# Patient Record
Sex: Female | Born: 1955 | Race: Black or African American | Hispanic: No | Marital: Single | Smoking: Never smoker
Health system: Southern US, Community
[De-identification: ages and names within clinical notes are randomized; demographics above are authoritative.]

## PROBLEM LIST (undated history)

## (undated) DIAGNOSIS — J302 Other seasonal allergic rhinitis: Secondary | ICD-10-CM

## (undated) DIAGNOSIS — S0300XA Dislocation of jaw, unspecified side, initial encounter: Secondary | ICD-10-CM

## (undated) DIAGNOSIS — K8020 Calculus of gallbladder without cholecystitis without obstruction: Secondary | ICD-10-CM

## (undated) DIAGNOSIS — K219 Gastro-esophageal reflux disease without esophagitis: Secondary | ICD-10-CM

## (undated) DIAGNOSIS — E119 Type 2 diabetes mellitus without complications: Secondary | ICD-10-CM

## (undated) DIAGNOSIS — N3289 Other specified disorders of bladder: Secondary | ICD-10-CM

## (undated) DIAGNOSIS — R112 Nausea with vomiting, unspecified: Secondary | ICD-10-CM

## (undated) DIAGNOSIS — M545 Low back pain, unspecified: Secondary | ICD-10-CM

## (undated) DIAGNOSIS — R51 Headache: Secondary | ICD-10-CM

## (undated) DIAGNOSIS — I10 Essential (primary) hypertension: Secondary | ICD-10-CM

## (undated) DIAGNOSIS — Z9889 Other specified postprocedural states: Secondary | ICD-10-CM

## (undated) DIAGNOSIS — K760 Fatty (change of) liver, not elsewhere classified: Secondary | ICD-10-CM

## (undated) DIAGNOSIS — E785 Hyperlipidemia, unspecified: Secondary | ICD-10-CM

## (undated) DIAGNOSIS — G4730 Sleep apnea, unspecified: Secondary | ICD-10-CM

## (undated) HISTORY — DX: Type 2 diabetes mellitus without complications: E11.9

## (undated) HISTORY — PX: COLONOSCOPY: SHX174

## (undated) HISTORY — PX: DILATION AND CURETTAGE OF UTERUS: SHX78

---

## 1998-06-30 ENCOUNTER — Ambulatory Visit (HOSPITAL_COMMUNITY): Admission: RE | Admit: 1998-06-30 | Discharge: 1998-06-30 | Payer: Self-pay | Admitting: Gastroenterology

## 1999-04-05 ENCOUNTER — Ambulatory Visit (HOSPITAL_COMMUNITY): Admission: RE | Admit: 1999-04-05 | Discharge: 1999-04-05 | Payer: Self-pay | Admitting: Family Medicine

## 2000-12-31 HISTORY — PX: ROTATOR CUFF REPAIR: SHX139

## 2001-10-27 ENCOUNTER — Ambulatory Visit (HOSPITAL_COMMUNITY): Admission: RE | Admit: 2001-10-27 | Discharge: 2001-10-27 | Payer: Self-pay | Admitting: Gastroenterology

## 2001-12-29 ENCOUNTER — Emergency Department (HOSPITAL_COMMUNITY): Admission: EM | Admit: 2001-12-29 | Discharge: 2001-12-29 | Payer: Self-pay | Admitting: Emergency Medicine

## 2002-08-22 ENCOUNTER — Encounter: Payer: Self-pay | Admitting: Emergency Medicine

## 2002-08-22 ENCOUNTER — Emergency Department (HOSPITAL_COMMUNITY): Admission: EM | Admit: 2002-08-22 | Discharge: 2002-08-22 | Payer: Self-pay | Admitting: Emergency Medicine

## 2002-10-20 ENCOUNTER — Encounter: Payer: Self-pay | Admitting: Family Medicine

## 2002-10-20 ENCOUNTER — Encounter: Admission: RE | Admit: 2002-10-20 | Discharge: 2002-10-20 | Payer: Self-pay | Admitting: Family Medicine

## 2002-12-31 HISTORY — PX: NASAL SEPTUM SURGERY: SHX37

## 2003-03-12 ENCOUNTER — Encounter: Payer: Self-pay | Admitting: Family Medicine

## 2003-03-12 ENCOUNTER — Encounter: Admission: RE | Admit: 2003-03-12 | Discharge: 2003-03-12 | Payer: Self-pay | Admitting: Family Medicine

## 2003-04-22 ENCOUNTER — Ambulatory Visit (HOSPITAL_BASED_OUTPATIENT_CLINIC_OR_DEPARTMENT_OTHER): Admission: RE | Admit: 2003-04-22 | Discharge: 2003-04-22 | Payer: Self-pay | Admitting: Otolaryngology

## 2003-05-21 ENCOUNTER — Ambulatory Visit (HOSPITAL_BASED_OUTPATIENT_CLINIC_OR_DEPARTMENT_OTHER): Admission: RE | Admit: 2003-05-21 | Discharge: 2003-05-21 | Payer: Self-pay | Admitting: Otolaryngology

## 2003-05-21 ENCOUNTER — Encounter (INDEPENDENT_AMBULATORY_CARE_PROVIDER_SITE_OTHER): Payer: Self-pay | Admitting: *Deleted

## 2004-07-07 ENCOUNTER — Encounter: Admission: RE | Admit: 2004-07-07 | Discharge: 2004-07-07 | Payer: Self-pay | Admitting: Specialist

## 2005-07-22 ENCOUNTER — Emergency Department (HOSPITAL_COMMUNITY): Admission: EM | Admit: 2005-07-22 | Discharge: 2005-07-22 | Payer: Self-pay | Admitting: Emergency Medicine

## 2006-08-09 ENCOUNTER — Encounter: Admission: RE | Admit: 2006-08-09 | Discharge: 2006-08-09 | Payer: Self-pay | Admitting: Specialist

## 2007-12-26 ENCOUNTER — Encounter: Admission: RE | Admit: 2007-12-26 | Discharge: 2007-12-26 | Payer: Self-pay | Admitting: Family Medicine

## 2008-12-30 ENCOUNTER — Emergency Department (HOSPITAL_COMMUNITY): Admission: EM | Admit: 2008-12-30 | Discharge: 2008-12-30 | Payer: Self-pay | Admitting: Emergency Medicine

## 2009-09-16 ENCOUNTER — Encounter: Admission: RE | Admit: 2009-09-16 | Discharge: 2009-09-16 | Payer: Self-pay | Admitting: Family Medicine

## 2010-07-21 ENCOUNTER — Encounter: Admission: RE | Admit: 2010-07-21 | Discharge: 2010-07-21 | Payer: Self-pay | Admitting: Family Medicine

## 2011-01-20 ENCOUNTER — Encounter: Payer: Self-pay | Admitting: Family Medicine

## 2011-05-18 NOTE — Op Note (Signed)
[name], [name]                      ACCOUNT NO.:  [id number]   MEDICAL RECORD NO.:  [id number]                   PATIENT TYPE:  AMB   LOCATION:  DSC                                  FACILITY:  [facility]   PHYSICIAN:  Hermelinda Medicus, M.D.                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   PROCEDURE:  Septal reconstruction, turbinate reduction, reduction of concha  bullosa.   SURGEON:  Hermelinda Medicus, M.D.   ANESTHESIA:  Local 1% Xylocaine with epinephrine, topical cocaine 200 mg  with general MAC anesthesia standby with Dr. Gelene Mink.   DESCRIPTION OF PROCEDURE:  The patient was placed in the supine position  under local anesthesia and was prepped and draped in the appropriate manner  and then the nose was anesthetized using 200 mg of topical cocaine in 60 cc  of 1% Xylocaine with epinephrine.  Once the anesthesia was instilled the  inferior turbinates were able to be pushed laterally using the butter knife.  No mucous membrane was removed and we gained considerable space in the  initial part of this procedure; especially so we could get not only space,  but visualization.  We also reduced the concha bullosa left using the polyp  forceps.  Again, no mucous membrane was removed.   At that point, we made a hemitransfixion incision on the left side, around  the columella to the right side, and elevated the mucoperichondrium back to  an area where the septum, as I expected, was telescoped and deviated to the  right and then swung back  to the left especially in the T-ethmoid region  indicative of some past history of nasal trauma.  Once the mucoperichondrium  and mucoperiosteum was elevated a strip of quadrilateral cartilage was taken  in the posterior aspect of the nose.  The open and close Jansen-Middleton's  were then used to take down this telescoped and traumatized ethmoid septum,  and once we worked back to a more thin septal structure, we worked then into  the vomerine septum which was also involved with the premaxillary crest  which was in quite good condition.  We were able to just fracture the septum  and bring it back to the midline.  Once this was completed the septal  deviation was corrected, closure with a 5-0 plain catgut and a through-and-  through septal suture using 4-0  plain x2 was used as a hemostatic stitch.  Intranasal dressing using Telfa  was placed.  The removal of this dressing will  hopefully be approximately  this afternoon at 3:30 and follow up will then be in 5 days, then 2 weeks,  and 4 weeks, and 6 weeks.                                               Hermelinda Medicus, M.D.    JC/MEDQ  D:  05/21/2003  T:  05/21/2003  Job:  [id number]   cc:   Teena Irani. Arlyce Dice, M.D.  [address]  [place]  [place] [address]  Fax: [phone]

## 2011-05-18 NOTE — H&P (Signed)
NAMEWILMARIE, [name]                      ACCOUNT NO.:  [id number]   MEDICAL RECORD NO.:  [id number]                   PATIENT TYPE:  AMB   LOCATION:  DSC                                  FACILITY:  [facility]   PHYSICIAN:  Hermelinda Medicus, M.D.                DATE OF BIRTH:  [birth year]   DATE OF ADMISSION:  05/21/2003  DATE OF DISCHARGE:                                HISTORY & PHYSICAL   HISTORY OF PRESENT ILLNESS:  The patient is a 55 year old female who works  in an environment of considerable dust and chemicals.  Entered my office  with multiple complaints, first of nasal obstruction, second of sinusitis  having had apparently five infections per year and after reviewing the  records noting her to be primarily on Augmentin and Tequin.  Thirdly, she  was having allergy problems and was on Allegra and fourthly she was having  the nasal obstruction with cough, associated sleep apnea.  She does weigh  220 pounds and with this she has had considerable obstruction, considerable  noisy breathing, and had a CAT scan done of her sinuses which shows some  mild maxillary sinusitis, but more concha bullosa and septal deviation and  she also has had a polysomnogram which was surprisingly good showing a  respiratory disturbance index of only 1.9, lowest O2 saturation of 89, but  loud snoring with mild oxygen desaturation.  She also was unable to get in  deep sleep.  After all this work-up her major problem because of the  sinusitis problem and the snoring was that of a septal deviation with nasal  obstruction and she now enters for a septal reconstruction and turbinate  reduction.   MEDICATIONS:  She takes the Allegra 180 and Nexium 40 daily.   ALLERGIES:  IBUPROFEN which caused tonsillar swelling and tongue swelling.   PAST SURGICAL HISTORY:  Her only surgery in the past is bone spurs off her  shoulder and a colonoscopy.   REVIEW OF SYSTEMS:  She has no cardiac, pulmonary, neurologic,  hematology  problems.  She does have the reflux as above mentioned.  She has no other  pain, skin, or any GU problems.   PHYSICAL EXAMINATION:  GENERAL:  She is a well-nourished to obese 215 pound  5 feet 3 inch female.  VITAL SIGNS:  Blood pressure 116/78, respirations 20, heart rate 60.  HEENT:  Ears are clear.  The tympanic membranes are clear.  The nose shows a  septal deviation where it is deviated toward the right in the high septum  and then toward the left as if she had some telescoping from possible  previous nasal trauma.  The turbinates are very large and obstructive as  well as a left concha bullosa.  The oral cavity is clear, fairly small.  The  tongue rides high because she has a full neck.  The neck, however, is free  of any thyromegaly, cervical  adenopathy, or mass.  The larynx is clear.  True cords, false cords, epiglottis, base of tongue are clear.  No  ulceration or mass.  Dentition is in fairly good condition.  CHEST:  Clear.  No rales, rhonchi, or wheezes.  CARDIOVASCULAR:  No __________ murmurs or gallops.  ABDOMEN:  Obese.  EXTREMITIES:  No liver, spleen, or kidneys palpable.  Unremarkable.   INITIAL DIAGNOSES:  1. Mild sleep apnea with severe snoring.  2. Persistent sinusitis and allergic rhinitis with septal deviation,     turbinate hypertrophy with concha bullosa.  3. History of bone spurs from her shoulders.  4.     History of colonoscopy.  5. History of allergy to ibuprofen.   Our plan is to do a septal reconstruction, turbinate reduction, reduction of  concha bullosa.                                               Hermelinda Medicus, M.D.    JC/MEDQ  D:  05/21/2003  T:  05/21/2003  Job:  [id number]   cc:   Teena Irani. Arlyce Dice, M.D.  [address]  [place]  [place] [address]  Fax: [phone]

## 2011-05-18 NOTE — Procedures (Signed)
[facility]  Patient:    [name], [name] Visit Number: [id number] MRN: [id number]          Service Type: END Location: ENDO Attending Physician:  Orland Mustard Dictated by:   Llana Aliment. Randa Evens, M.D. Proc. Date: 10/27/01 Admit Date:  10/27/2001   CC:         Teena Irani. Arlyce Dice, M.D.                           Procedure Report  PROCEDURE:  Colonoscopy.  MEDICATIONS:  Fentanyl 75 mcg, Versed 8 mg IV.  INDICATION:  Rectal bleeding.  He has had an unusual polyp removed in the past, suggesting angiodysplasia; it is probably a hyperplastic polyp.  DESCRIPTION OF PROCEDURE:  The procedure had been explained to the patient and consent obtained.  With the patient in left lateral decubitus position, the Olympus adult video colonoscope was inserted and advanced under direct visualization.  The prep was excellent.  The scope was advanced to the cecum, where the ileocecal valve was seen and appendiceal orifice seen.  The scope was withdrawn.  The cecum, ascending colon, hepatic flexure, transverse colon, splenic flexure, descending and sigmoid colon were seen well upon removal; no polyps seen.  Internal hemorrhoids were seen in the rectum.  The scope was withdrawn.  The patient tolerated the procedure well.  ASSESSMENT:  Rectal bleeding, probably due to internal hemorrhoids.  PLAN:  Will recommend repeating the procedure in three to four weeks. Dictated by:   Llana Aliment. Randa Evens, M.D. Attending Physician:  Orland Mustard DD:  10/27/01 TD:  10/28/01 Job: [id number]

## 2011-10-04 LAB — POCT I-STAT, CHEM 8
BUN: 6 mg/dL (ref 6–23)
Glucose, Bld: 146 mg/dL — ABNORMAL HIGH (ref 70–99)
Sodium: 139 mEq/L (ref 135–145)
TCO2: 29 mmol/L (ref 0–100)

## 2013-07-08 ENCOUNTER — Encounter (INDEPENDENT_AMBULATORY_CARE_PROVIDER_SITE_OTHER): Payer: Self-pay

## 2013-07-10 ENCOUNTER — Ambulatory Visit (INDEPENDENT_AMBULATORY_CARE_PROVIDER_SITE_OTHER): Payer: 59 | Admitting: General Surgery

## 2013-07-10 ENCOUNTER — Encounter (INDEPENDENT_AMBULATORY_CARE_PROVIDER_SITE_OTHER): Payer: Self-pay | Admitting: General Surgery

## 2013-07-10 VITALS — BP 128/86 | HR 71 | Temp 97.0°F | Resp 16 | Ht 63.0 in | Wt 230.4 lb

## 2013-07-10 DIAGNOSIS — K8020 Calculus of gallbladder without cholecystitis without obstruction: Secondary | ICD-10-CM

## 2013-07-10 NOTE — Progress Notes (Signed)
Patient ID: [name], female   DOB: 18-Oct-1956, 57 y.o.   MRN: [id number]  No chief complaint on file.   HPI [name] is a 57 y.o. female. This patient is referred by Dr. Dareen Piano for evaluation of symptomatic cholelithiasis.  She saw her primary physician for routine followup for her diabetes and laboratory studies and she was noted to have elevated LFTs. Ultrasound was obtained which demonstrated cholelithiasis with multiple gallstones measuring up to 1.2 cm without evidence of acute cholecystitis. Is also showed fatty infiltration of the liver. She says that she does have some right-sided and right flank and back pain almost every day and she describes this as constant and "sore". She says that it is worse with eating but she has not attributed to any particular type of food. She has some associated abdominal bloating and nausea without any vomiting or fevers. Her bowels are normal but she does have some occasional diarrhea. She said that she has a family history of gallbladder problems as well and her mother require cholecystectomy as well. She says that her diabetes has not been very well controlled since the diagnosis of about 2 years ago and has hypertension as well.  HPI  Past Medical History  Diagnosis Date  . Diabetes mellitus without complication     No past surgical history on file.  No family history on file.  Social History History  Substance Use Topics  . Smoking status: Never Smoker   . Smokeless tobacco: Not on file  . Alcohol Use: No    Allergies  Allergen Reactions  . Ace Inhibitors   . Diovan (Valsartan)   . Ibuprofen     Current Outpatient Prescriptions  Medication Sig Dispense Refill  . glimepiride (AMARYL) 4 MG tablet Take 4 mg by mouth daily before breakfast.      . metFORMIN (GLUCOPHAGE) 1000 MG tablet Take 1,000 mg by mouth 2 (two) times daily with a meal.      . metoprolol-hydrochlorothiazide (LOPRESSOR HCT) 50-25 MG per tablet Take 1  tablet by mouth daily.      . montelukast (SINGULAIR) 10 MG tablet Take 10 mg by mouth at bedtime.      . triamcinolone (NASACORT AQ) 55 MCG/ACT nasal inhaler Place 2 sprays into the nose daily.       No current facility-administered medications for this visit.    Review of Systems Review of Systems All other review of systems negative or noncontributory except as stated in the HPI   Blood pressure 128/86, pulse 71, temperature 97 F (36.1 C), temperature source Temporal, resp. rate 16, height 5\' 3"  (1.6 m), weight 230 lb 6.4 oz (104.509 kg).  Physical Exam Physical Exam Physical Exam  Nursing note and vitals reviewed. Constitutional: She is oriented to person, place, and time. She appears well-developed and well-nourished. No distress.  HENT:  Head: Normocephalic and atraumatic.  Mouth/Throat: No oropharyngeal exudate.  Eyes: Conjunctivae and EOM are normal. Pupils are equal, round, and reactive to light. Right eye exhibits no discharge. Left eye exhibits no discharge. No scleral icterus.  Neck: Normal range of motion. Neck supple. No tracheal deviation present.  Cardiovascular: Normal rate, regular rhythm, normal heart sounds and intact distal pulses.   Pulmonary/Chest: Effort normal and breath sounds normal. No stridor. No respiratory distress. She has no wheezes.  Abdominal: Soft. Bowel sounds are normal. She exhibits no distension and no mass. There is mild epigastric tenderness. There is no rebound and no guarding.  Musculoskeletal: Normal  range of motion. She exhibits no edema and no tenderness.  Neurological: She is alert and oriented to person, place, and time.  Skin: Skin is warm and dry. No rash noted. She is not diaphoretic. No erythema. No pallor.  Psychiatric: She has a normal mood and affect. Her behavior is normal. Judgment and thought content normal.    Data Reviewed Outside records, labs, and [place]  Assessment    Symptomatic cholelithiasis She does have  cholelithiasis on ultrasound and symptoms consistent with symptomatic cholelithiasis. I have offered her to endoscopic cholecystectomy for possible relief of her symptoms.  Those she does have right-sided abdominal and back tenderness with nausea and bloating, and found not 100% certain that her symptoms are from her gallbladder but I think we certainly have enough evidence to justify cholecystectomy.  I did discuss with her the procedure and its risks.The risks of infection, bleeding, pain, persistent symptoms, scarring, injury to bowel or bile ducts, retained stone, diarrhea, need for additional procedures, and need for open surgery discussed with the patient.  I provided some literature to read as well. She is leaning towards cholecystectomy but I recommended that she take a few days to think about the options and discuss this with her primary care physician and to call the back next week if she would like to go ahead and pursue cholecystectomy. Morbid obesity with obesity related comorbidities of diabetes, hypertension, and fatty liver I also discussed with her the benefits of weight loss and discuss some of the weight loss surgical options. She is having a hard time getting her diabetes under control and I think her elevated LFTs are most likely due to fatty liver. I think that she would benefit from significant weight loss and if she was interested in weight loss surgery, I would be happy to discuss this further with her as well.       Plan    She will take a few days to discuss this with her primary physician is pink about the options and she will call him back next week to let me know her decision in to possibly schedule cholecystectomy        Gilma Bessette DAVID 07/10/2013, 10:55 AM

## 2013-07-14 ENCOUNTER — Telehealth (INDEPENDENT_AMBULATORY_CARE_PROVIDER_SITE_OTHER): Payer: Self-pay

## 2013-07-14 NOTE — Telephone Encounter (Signed)
The pt called to state she is ready to schedule Gallbladder surgery.  She is having pain.  Her medical md is out sick so she hasn't talked to her but she feels she wants her to have it done.  She does not want to pursue the Lap Band right now because she has issues with her stomach.

## 2013-07-22 ENCOUNTER — Other Ambulatory Visit (INDEPENDENT_AMBULATORY_CARE_PROVIDER_SITE_OTHER): Payer: Self-pay | Admitting: General Surgery

## 2013-07-22 DIAGNOSIS — K8020 Calculus of gallbladder without cholecystitis without obstruction: Secondary | ICD-10-CM

## 2013-07-27 ENCOUNTER — Telehealth (INDEPENDENT_AMBULATORY_CARE_PROVIDER_SITE_OTHER): Payer: Self-pay | Admitting: General Surgery

## 2013-07-27 NOTE — Telephone Encounter (Signed)
Spoke to patient about financial responsibilities to schedule surgery 07/23/13 ,patient will call back to schedule

## 2013-08-07 ENCOUNTER — Encounter (HOSPITAL_COMMUNITY)
Admission: RE | Admit: 2013-08-07 | Discharge: 2013-08-07 | Disposition: A | Discharge status: Home / Self Care | Payer: 59 | Source: Ambulatory Visit | Attending: General Surgery | Admitting: General Surgery

## 2013-08-07 ENCOUNTER — Encounter (HOSPITAL_COMMUNITY): Payer: Self-pay | Admitting: Pharmacy Technician

## 2013-08-07 ENCOUNTER — Ambulatory Visit (HOSPITAL_COMMUNITY)
Admission: RE | Admit: 2013-08-07 | Discharge: 2013-08-07 | Disposition: A | Discharge status: Home / Self Care | Payer: 59 | Source: Ambulatory Visit | Attending: General Surgery | Admitting: General Surgery

## 2013-08-07 ENCOUNTER — Encounter (HOSPITAL_COMMUNITY): Payer: Self-pay

## 2013-08-07 VITALS — BP 135/79 | HR 59 | Temp 98.2°F | Resp 16 | Ht 63.0 in | Wt 227.0 lb

## 2013-08-07 DIAGNOSIS — I498 Other specified cardiac arrhythmias: Secondary | ICD-10-CM | POA: Insufficient documentation

## 2013-08-07 DIAGNOSIS — R9431 Abnormal electrocardiogram [ECG] [EKG]: Secondary | ICD-10-CM | POA: Insufficient documentation

## 2013-08-07 DIAGNOSIS — Z01818 Encounter for other preprocedural examination: Secondary | ICD-10-CM | POA: Insufficient documentation

## 2013-08-07 DIAGNOSIS — Z01810 Encounter for preprocedural cardiovascular examination: Secondary | ICD-10-CM | POA: Insufficient documentation

## 2013-08-07 DIAGNOSIS — Z01812 Encounter for preprocedural laboratory examination: Secondary | ICD-10-CM | POA: Insufficient documentation

## 2013-08-07 DIAGNOSIS — K8020 Calculus of gallbladder without cholecystitis without obstruction: Secondary | ICD-10-CM

## 2013-08-07 HISTORY — DX: Nausea with vomiting, unspecified: R11.2

## 2013-08-07 HISTORY — DX: Other seasonal allergic rhinitis: J30.2

## 2013-08-07 HISTORY — DX: Headache: R51

## 2013-08-07 HISTORY — DX: Gastro-esophageal reflux disease without esophagitis: K21.9

## 2013-08-07 HISTORY — DX: Low back pain, unspecified: M54.50

## 2013-08-07 HISTORY — DX: Other specified disorders of bladder: N32.89

## 2013-08-07 HISTORY — DX: Fatty (change of) liver, not elsewhere classified: K76.0

## 2013-08-07 HISTORY — DX: Essential (primary) hypertension: I10

## 2013-08-07 HISTORY — DX: Low back pain: M54.5

## 2013-08-07 HISTORY — DX: Other specified postprocedural states: Z98.890

## 2013-08-07 HISTORY — DX: Sleep apnea, unspecified: G47.30

## 2013-08-07 LAB — COMPREHENSIVE METABOLIC PANEL
ALT: 23 U/L (ref 0–35)
AST: 16 U/L (ref 0–37)
Albumin: 3.3 g/dL — ABNORMAL LOW (ref 3.5–5.2)
Creatinine, Ser: 0.63 mg/dL (ref 0.50–1.10)
GFR calc Af Amer: >90 mL/min (ref >90)
GFR calc non Af Amer: >90 mL/min (ref >90)
Glucose, Bld: 135 mg/dL — ABNORMAL HIGH (ref 70–99)
Potassium: 3.6 mEq/L (ref 3.5–5.1)
Sodium: 139 mEq/L (ref 135–145)
Total Bilirubin: 0.4 mg/dL (ref 0.3–1.2)

## 2013-08-07 LAB — CBC WITH DIFFERENTIAL/PLATELET
Basophils Absolute: 0 10*3/uL (ref 0.0–0.1)
Basophils Relative: 0 % (ref 0–1)
HCT: 37.5 % (ref 36.0–46.0)
Hemoglobin: 13.1 g/dL (ref 12.0–15.0)
MCH: 26.2 pg (ref 26.0–34.0)
MCHC: 34.9 g/dL (ref 30.0–36.0)
Neutro Abs: 3.8 10*3/uL (ref 1.7–7.7)
Neutrophils Relative %: 50 % (ref 43–77)
Platelets: 260 10*3/uL (ref 150–400)

## 2013-08-07 NOTE — Patient Instructions (Addendum)
20 [name]  08/07/2013   Your procedure is scheduled on: 08/14/13  Report to [facility] at 5:30 AM.  Call this number if you have problems the morning of surgery [phone]: [phone]   Remember: please do not take any diabetic medication on the day of surgery   Do not eat food or drink liquids After Midnight.     Do not wear jewelry, make-up or nail polish.  Do not wear lotions, powders, or perfumes. You may wear deodorant.  Do not shave 48 hours prior to surgery. Men may shave face and neck.  Do not bring valuables to the hospital.  Contacts, dentures or bridgework may not be worn into surgery.     Patients discharged the day of surgery will not be allowed to drive home.  Name and phone number of your driver: brothers or sisters    Birdie Sons, RN  pre op nurse call if needed [phone]    FAILURE TO FOLLOW THESE INSTRUCTIONS MAY RESULT IN CANCELLATION OF YOUR SURGERY   Patient Signature: ___________________________________________

## 2013-08-14 ENCOUNTER — Ambulatory Visit (HOSPITAL_COMMUNITY)
Admission: RE | Admit: 2013-08-14 | Discharge: 2013-08-14 | Disposition: A | Discharge status: Home / Self Care | Payer: 59 | Source: Ambulatory Visit | Attending: General Surgery | Admitting: General Surgery

## 2013-08-14 ENCOUNTER — Encounter (HOSPITAL_COMMUNITY)
Admission: RE | Disposition: A | Discharge status: Home / Self Care | Payer: Self-pay | Source: Ambulatory Visit | Attending: General Surgery

## 2013-08-14 ENCOUNTER — Encounter (HOSPITAL_COMMUNITY): Payer: Self-pay | Admitting: *Deleted

## 2013-08-14 ENCOUNTER — Ambulatory Visit (HOSPITAL_COMMUNITY): Payer: 59

## 2013-08-14 ENCOUNTER — Ambulatory Visit (HOSPITAL_COMMUNITY): Payer: 59 | Admitting: Anesthesiology

## 2013-08-14 ENCOUNTER — Encounter (HOSPITAL_COMMUNITY): Payer: Self-pay | Admitting: Anesthesiology

## 2013-08-14 DIAGNOSIS — E119 Type 2 diabetes mellitus without complications: Secondary | ICD-10-CM | POA: Insufficient documentation

## 2013-08-14 DIAGNOSIS — G4730 Sleep apnea, unspecified: Secondary | ICD-10-CM | POA: Insufficient documentation

## 2013-08-14 DIAGNOSIS — K8020 Calculus of gallbladder without cholecystitis without obstruction: Secondary | ICD-10-CM

## 2013-08-14 DIAGNOSIS — Z79899 Other long term (current) drug therapy: Secondary | ICD-10-CM | POA: Insufficient documentation

## 2013-08-14 DIAGNOSIS — K8010 Calculus of gallbladder with chronic cholecystitis without obstruction: Secondary | ICD-10-CM

## 2013-08-14 DIAGNOSIS — I10 Essential (primary) hypertension: Secondary | ICD-10-CM | POA: Insufficient documentation

## 2013-08-14 DIAGNOSIS — K219 Gastro-esophageal reflux disease without esophagitis: Secondary | ICD-10-CM | POA: Insufficient documentation

## 2013-08-14 HISTORY — PX: CHOLECYSTECTOMY: SHX55

## 2013-08-14 LAB — GLUCOSE, CAPILLARY

## 2013-08-14 SURGERY — LAPAROSCOPIC CHOLECYSTECTOMY WITH INTRAOPERATIVE CHOLANGIOGRAM
Anesthesia: General | Wound class: Contaminated

## 2013-08-14 MED ORDER — PROMETHAZINE HCL 25 MG/ML IJ SOLN
INTRAMUSCULAR | Status: AC
  Filled 2013-08-14: qty 1

## 2013-08-14 MED ORDER — PROMETHAZINE HCL 25 MG/ML IJ SOLN
6.2500 mg | INTRAMUSCULAR | Status: DC | PRN
  Administered 2013-08-14: 6.25 mg via INTRAVENOUS

## 2013-08-14 MED ORDER — ROCURONIUM BROMIDE 100 MG/10ML IV SOLN
INTRAVENOUS | Status: DC | PRN
  Administered 2013-08-14: 30 mg via INTRAVENOUS

## 2013-08-14 MED ORDER — BUPIVACAINE HCL (PF) 0.25 % IJ SOLN
INTRAMUSCULAR | Status: AC
  Filled 2013-08-14: qty 30

## 2013-08-14 MED ORDER — SCOPOLAMINE 1 MG/3DAYS TD PT72
MEDICATED_PATCH | TRANSDERMAL | Status: AC
  Filled 2013-08-14: qty 1

## 2013-08-14 MED ORDER — SCOPOLAMINE 1 MG/3DAYS TD PT72
MEDICATED_PATCH | TRANSDERMAL | Status: DC | PRN
  Administered 2013-08-14: 1 via TRANSDERMAL

## 2013-08-14 MED ORDER — ONDANSETRON HCL 4 MG/2ML IJ SOLN
INTRAMUSCULAR | Status: DC | PRN
  Administered 2013-08-14: 4 mg via INTRAVENOUS

## 2013-08-14 MED ORDER — NEOSTIGMINE METHYLSULFATE 1 MG/ML IJ SOLN
INTRAMUSCULAR | Status: DC | PRN
  Administered 2013-08-14: 4 mg via INTRAVENOUS

## 2013-08-14 MED ORDER — HYDROCODONE-ACETAMINOPHEN 5-325 MG PO TABS: 1.0000 | ORAL_TABLET | ORAL | Status: DC | PRN

## 2013-08-14 MED ORDER — MEPERIDINE HCL 50 MG/ML IJ SOLN: 6.2500 mg | INTRAMUSCULAR | Status: DC | PRN

## 2013-08-14 MED ORDER — LACTATED RINGERS IV SOLN: INTRAVENOUS | Status: DC | PRN

## 2013-08-14 MED ORDER — LIDOCAINE HCL (PF) 2 % IJ SOLN
INTRAMUSCULAR | Status: DC | PRN
  Administered 2013-08-14: 20 mg

## 2013-08-14 MED ORDER — CEFAZOLIN SODIUM-DEXTROSE 2-3 GM-% IV SOLR
2.0000 g | INTRAVENOUS | Status: AC
  Administered 2013-08-14: 2 g via INTRAVENOUS

## 2013-08-14 MED ORDER — LACTATED RINGERS IV SOLN
INTRAVENOUS | Status: DC
  Administered 2013-08-14: 09:00:00 via INTRAVENOUS

## 2013-08-14 MED ORDER — LIDOCAINE-EPINEPHRINE (PF) 1 %-1:200000 IJ SOLN
INTRAMUSCULAR | Status: DC | PRN
  Administered 2013-08-14: 22.5 mL

## 2013-08-14 MED ORDER — METOCLOPRAMIDE HCL 5 MG/ML IJ SOLN
10.0000 mg | Freq: Once | INTRAMUSCULAR | Status: AC
  Administered 2013-08-14: 10 mg via INTRAVENOUS

## 2013-08-14 MED ORDER — IOHEXOL 300 MG/ML  SOLN
INTRAMUSCULAR | Status: AC
  Filled 2013-08-14: qty 1

## 2013-08-14 MED ORDER — OXYCODONE HCL 5 MG PO TABS
5.0000 mg | ORAL_TABLET | Freq: Once | ORAL | Status: AC | PRN
  Administered 2013-08-14: 5 mg via ORAL
  Filled 2013-08-14: qty 1

## 2013-08-14 MED ORDER — OXYCODONE HCL 5 MG/5ML PO SOLN
5.0000 mg | Freq: Once | ORAL | Status: AC | PRN
  Filled 2013-08-14: qty 5

## 2013-08-14 MED ORDER — CEFAZOLIN SODIUM-DEXTROSE 2-3 GM-% IV SOLR
INTRAVENOUS | Status: AC
  Filled 2013-08-14: qty 50

## 2013-08-14 MED ORDER — SODIUM CHLORIDE 0.9 % IR SOLN
Status: DC | PRN
  Administered 2013-08-14: 1000 mL

## 2013-08-14 MED ORDER — METOCLOPRAMIDE HCL 5 MG/ML IJ SOLN
INTRAMUSCULAR | Status: AC
  Filled 2013-08-14: qty 2

## 2013-08-14 MED ORDER — HEPARIN SODIUM (PORCINE) 5000 UNIT/ML IJ SOLN
5000.0000 [IU] | Freq: Once | INTRAMUSCULAR | Status: AC
  Administered 2013-08-14: 5000 [IU] via SUBCUTANEOUS
  Filled 2013-08-14: qty 1

## 2013-08-14 MED ORDER — LIDOCAINE-EPINEPHRINE 1 %-1:100000 IJ SOLN
INTRAMUSCULAR | Status: AC
  Filled 2013-08-14: qty 1

## 2013-08-14 MED ORDER — MIDAZOLAM HCL 5 MG/5ML IJ SOLN
INTRAMUSCULAR | Status: DC | PRN
  Administered 2013-08-14: 2 mg via INTRAVENOUS

## 2013-08-14 MED ORDER — HYDROMORPHONE HCL PF 1 MG/ML IJ SOLN
0.2500 mg | INTRAMUSCULAR | Status: DC | PRN
  Administered 2013-08-14: 0.5 mg via INTRAVENOUS

## 2013-08-14 MED ORDER — LACTATED RINGERS IR SOLN
Status: DC | PRN
  Administered 2013-08-14: 1

## 2013-08-14 MED ORDER — FENTANYL CITRATE 0.05 MG/ML IJ SOLN
INTRAMUSCULAR | Status: DC | PRN
  Administered 2013-08-14 (×2): 50 ug via INTRAVENOUS
  Administered 2013-08-14: 100 ug via INTRAVENOUS
  Administered 2013-08-14: 50 ug via INTRAVENOUS

## 2013-08-14 MED ORDER — LACTATED RINGERS IV SOLN
INTRAVENOUS | Status: DC | PRN
  Administered 2013-08-14: 07:00:00 via INTRAVENOUS

## 2013-08-14 MED ORDER — SUCCINYLCHOLINE CHLORIDE 20 MG/ML IJ SOLN
INTRAMUSCULAR | Status: DC | PRN
  Administered 2013-08-14: 100 mg via INTRAVENOUS

## 2013-08-14 MED ORDER — PROPOFOL 10 MG/ML IV BOLUS
INTRAVENOUS | Status: DC | PRN
  Administered 2013-08-14: 150 mg via INTRAVENOUS

## 2013-08-14 MED ORDER — GLYCOPYRROLATE 0.2 MG/ML IJ SOLN
INTRAMUSCULAR | Status: DC | PRN
  Administered 2013-08-14: 0.6 mg via INTRAVENOUS

## 2013-08-14 MED ORDER — HYDROMORPHONE HCL PF 1 MG/ML IJ SOLN
INTRAMUSCULAR | Status: AC
  Filled 2013-08-14: qty 1

## 2013-08-14 MED ORDER — SODIUM CHLORIDE 0.9 % IJ SOLN
INTRAMUSCULAR | Status: DC | PRN
  Administered 2013-08-14: 08:00:00

## 2013-08-14 MED ORDER — METOPROLOL TARTRATE 50 MG PO TABS
50.0000 mg | ORAL_TABLET | Freq: Once | ORAL | Status: AC
  Administered 2013-08-14: 50 mg via ORAL
  Filled 2013-08-14: qty 1

## 2013-08-14 MED ORDER — BUPIVACAINE HCL (PF) 0.25 % IJ SOLN
INTRAMUSCULAR | Status: DC | PRN
  Administered 2013-08-14: 22.5 mL

## 2013-08-14 SURGICAL SUPPLY — 45 items
ADH SKN CLS APL DERMABOND .7 (GAUZE/BANDAGES/DRESSINGS) ×1
APPLICATOR COTTON TIP 6IN STRL (MISCELLANEOUS) IMPLANT
APPLIER CLIP LOGIC TI 5 (MISCELLANEOUS) IMPLANT
APPLIER CLIP ROT 10 11.4 M/L (STAPLE) ×2
APR CLP MED LRG 11.4X10 (STAPLE) ×1
APR CLP MED LRG 33X5 (MISCELLANEOUS)
BAG SPEC RTRVL LRG 6X4 10 (ENDOMECHANICALS) ×1
BLADE HEX COATED 2.75 (ELECTRODE) ×2 IMPLANT
CABLE HIGH FREQUENCY MONO STRZ (ELECTRODE) ×2 IMPLANT
CANISTER SUCTION 2500CC (MISCELLANEOUS) ×2 IMPLANT
CATH REDDICK CHOLANGI 4FR 50CM (CATHETERS) IMPLANT
CHLORAPREP W/TINT 26ML (MISCELLANEOUS) ×2 IMPLANT
CLIP APPLIE ROT 10 11.4 M/L (STAPLE) ×1 IMPLANT
CLOTH BEACON ORANGE TIMEOUT ST (SAFETY) ×2 IMPLANT
DECANTER SPIKE VIAL GLASS SM (MISCELLANEOUS) ×2 IMPLANT
DERMABOND ADVANCED (GAUZE/BANDAGES/DRESSINGS) ×1
DERMABOND ADVANCED .7 DNX12 (GAUZE/BANDAGES/DRESSINGS) IMPLANT
DRAPE C-ARM 42X120 X-RAY (DRAPES) ×2 IMPLANT
DRAPE LAPAROSCOPIC ABDOMINAL (DRAPES) ×2 IMPLANT
DRAPE UTILITY XL STRL (DRAPES) ×2 IMPLANT
ELECT REM PT RETURN 9FT ADLT (ELECTROSURGICAL) ×2
ELECTRODE REM PT RTRN 9FT ADLT (ELECTROSURGICAL) ×1 IMPLANT
ENDOLOOP SUT PDS II  0 18 (SUTURE)
ENDOLOOP SUT PDS II 0 18 (SUTURE) IMPLANT
GLOVE SURG SS PI 7.5 STRL IVOR (GLOVE) ×4 IMPLANT
GOWN STRL REIN XL XLG (GOWN DISPOSABLE) ×6 IMPLANT
KIT BASIN OR (CUSTOM PROCEDURE TRAY) ×2 IMPLANT
NS IRRIG 1000ML POUR BTL (IV SOLUTION) ×2 IMPLANT
PENCIL BUTTON HOLSTER BLD 10FT (ELECTRODE) ×2 IMPLANT
POUCH SPECIMEN RETRIEVAL 10MM (ENDOMECHANICALS) ×2 IMPLANT
SCISSORS LAP 5X35 DISP (ENDOMECHANICALS) ×2 IMPLANT
SET CHOLANGIOGRAPH MIX (MISCELLANEOUS) ×2 IMPLANT
SET IRRIG TUBING LAPAROSCOPIC (IRRIGATION / IRRIGATOR) ×2 IMPLANT
STRIP CLOSURE SKIN 1/2X4 (GAUZE/BANDAGES/DRESSINGS) IMPLANT
SUT MNCRL AB 4-0 PS2 18 (SUTURE) ×3 IMPLANT
SUT VICRYL 0 UR6 27IN ABS (SUTURE) ×2 IMPLANT
SYR 20CC LL (SYRINGE) ×2 IMPLANT
TOWEL OR 17X26 10 PK STRL BLUE (TOWEL DISPOSABLE) ×2 IMPLANT
TOWEL OR NON WOVEN STRL DISP B (DISPOSABLE) ×2 IMPLANT
TRAY LAP CHOLE (CUSTOM PROCEDURE TRAY) ×2 IMPLANT
TROCAR BALLN 12MMX100 BLUNT (TROCAR) ×2 IMPLANT
TROCAR BLADELESS OPT 5 75 (ENDOMECHANICALS) ×2 IMPLANT
TROCAR SLEEVE XCEL 5X75 (ENDOMECHANICALS) ×2 IMPLANT
TROCAR XCEL NON-BLD 11X100MML (ENDOMECHANICALS) ×2 IMPLANT
TUBING INSUFFLATION 10FT LAP (TUBING) ×2 IMPLANT

## 2013-08-14 NOTE — Transfer of Care (Signed)
Immediate Anesthesia Transfer of Care Note  Patient: [name]  Procedure(s) Performed: Procedure(s) (LRB): LAPAROSCOPIC CHOLECYSTECTOMY WITH INTRAOPERATIVE CHOLANGIOGRAM (N/A)  Patient Location: PACU  Anesthesia Type: General  Level of Consciousness: sedated, patient cooperative and responds to stimulaton  Airway & Oxygen Therapy: Patient Spontanous Breathing and Patient connected to face mask oxgen  Post-op Assessment: Report given to PACU RN and Post -op Vital signs reviewed and stable  Post vital signs: Reviewed and stable  Complications: No apparent anesthesia complications

## 2013-08-14 NOTE — Anesthesia Preprocedure Evaluation (Addendum)
Anesthesia Evaluation  Patient identified by MRN, date of birth, ID band Patient awake    Reviewed: Allergy & Precautions, H&P , NPO status , Patient's Chart, lab work & pertinent test results  History of Anesthesia Complications (+) PONV  Airway Mallampati: II TM Distance: >3 FB Neck ROM: Full    Dental  (+) Dental Advisory Given and Teeth Intact   Pulmonary neg pulmonary ROS, sleep apnea ,  breath sounds clear to auscultation        Cardiovascular hypertension, Pt. on medications Rhythm:Regular Rate:Normal     Neuro/Psych  Headaches, negative neurological ROS  negative psych ROS   GI/Hepatic Neg liver ROS, GERD-  ,  Endo/Other  diabetes, Type 2, Oral Hypoglycemic AgentsMorbid obesity  Renal/GU negative Renal ROS     Musculoskeletal negative musculoskeletal ROS (+)   Abdominal (+) + obese,   Peds  Hematology negative hematology ROS (+)   Anesthesia Other Findings   Reproductive/Obstetrics negative OB ROS                         Anesthesia Physical Anesthesia Plan  ASA: III  Anesthesia Plan: General   Post-op Pain Management:    Induction: Intravenous  Airway Management Planned: Oral ETT  Additional Equipment:   Intra-op Plan:   Post-operative Plan: Extubation in OR  Informed Consent: I have reviewed the patients History and Physical, chart, labs and discussed the procedure including the risks, benefits and alternatives for the proposed anesthesia with the patient or authorized representative who has indicated his/her understanding and acceptance.   Dental advisory given  Plan Discussed with: CRNA  Anesthesia Plan Comments:         Anesthesia Quick Evaluation

## 2013-08-14 NOTE — H&P (Signed)
[name] is a 57 y.o. female. This patient is referred by Dr. Dareen Piano for evaluation of symptomatic cholelithiasis. She saw her primary physician for routine followup for her diabetes and laboratory studies and she was noted to have elevated LFTs. Ultrasound was obtained which demonstrated cholelithiasis with multiple gallstones measuring up to 1.2 cm without evidence of acute cholecystitis. Is also showed fatty infiltration of the liver. She says that she does have some right-sided and right flank and back pain almost every day and she describes this as constant and "sore". She says that it is worse with eating but she has not attributed to any particular type of food. She has some associated abdominal bloating and nausea without any vomiting or fevers. Her bowels are normal but she does have some occasional diarrhea. She said that she has a family history of gallbladder problems as well and her mother require cholecystectomy as well. She says that her diabetes has not been very well controlled since the diagnosis of about 2 years ago and has hypertension as well.  HPI  Past Medical History   Diagnosis  Date   .  Diabetes mellitus without complication    No past surgical history on file.  No family history on file.  Social History  History   Substance Use Topics   .  Smoking status:  Never Smoker   .  Smokeless tobacco:  Not on file   .  Alcohol Use:  No    Allergies   Allergen  Reactions   .  Ace Inhibitors    .  Diovan (Valsartan)    .  Ibuprofen     Current Outpatient Prescriptions   Medication  Sig  Dispense  Refill   .  glimepiride (AMARYL) 4 MG tablet  Take 4 mg by mouth daily before breakfast.     .  metFORMIN (GLUCOPHAGE) 1000 MG tablet  Take 1,000 mg by mouth 2 (two) times daily with a meal.     .  metoprolol-hydrochlorothiazide (LOPRESSOR HCT) 50-25 MG per tablet  Take 1 tablet by mouth daily.     .  montelukast (SINGULAIR) 10 MG tablet  Take 10 mg by mouth at  bedtime.     .  triamcinolone (NASACORT AQ) 55 MCG/ACT nasal inhaler  Place 2 sprays into the nose daily.      No current facility-administered medications for this visit.   Review of Systems  Review of Systems  All other review of systems negative or noncontributory except as stated in the HPI  Wt Readings from Last 3 Encounters:  08/07/13 227 lb (102.967 kg)  07/10/13 230 lb 6.4 oz (104.509 kg)   Temp Readings from Last 3 Encounters:  08/14/13 98.1 F (36.7 C) Oral  08/14/13 98.1 F (36.7 C) Oral  08/07/13 98.2 F (36.8 C) Oral   BP Readings from Last 3 Encounters:  08/14/13 135/79  08/14/13 135/79  08/07/13 135/79   Pulse Readings from Last 3 Encounters:  08/14/13 72  08/14/13 72  08/07/13 59    Physical Exam  Physical Exam  Physical Exam  Nursing note and vitals reviewed.  Constitutional: She is oriented to person, place, and time. She appears well-developed and well-nourished. No distress.  HENT:  Head: Normocephalic and atraumatic.  Mouth/Throat: No oropharyngeal exudate.  Eyes: Conjunctivae and EOM are normal. Pupils are equal, round, and reactive to light. Right eye exhibits no discharge. Left eye exhibits no discharge. No scleral icterus.  Neck: Normal range of motion. Neck  supple. No tracheal deviation present.  Cardiovascular: Normal rate, regular rhythm, normal heart sounds and intact distal pulses.  Pulmonary/Chest: Effort normal and breath sounds normal. No stridor. No respiratory distress. She has no wheezes.  Abdominal: Soft. Bowel sounds are normal. She exhibits no distension and no mass. There is mild epigastric tenderness. There is no rebound and no guarding.  Musculoskeletal: Normal range of motion. She exhibits no edema and no tenderness.  Neurological: She is alert and oriented to person, place, and time.  Skin: Skin is warm and dry. No rash noted. She is not diaphoretic. No erythema. No pallor.  Psychiatric: She has a normal mood and affect. Her  behavior is normal. Judgment and thought content normal.  Data Reviewed  Outside records, labs, and [place]  Assessment  Symptomatic cholelithiasis  I have seen and evaluated this patient.  She denies any changes since our last visit.  She does have cholelithiasis on ultrasound and symptoms consistent with symptomatic cholelithiasis. I have offered her to endoscopic cholecystectomy for possible relief of her symptoms. Those she does have right-sided abdominal and back tenderness with nausea and bloating, and found not 100% certain that her symptoms are from her gallbladder but I think we certainly have enough evidence to justify cholecystectomy. I did discuss with her the procedure and its risks.The risks of infection, bleeding, pain, persistent symptoms, scarring, injury to bowel or bile ducts, retained stone, diarrhea, need for additional procedures, and need for open surgery discussed with the patient.  She desires to proceed with lap chole/IOC

## 2013-08-14 NOTE — Preoperative (Signed)
Beta Blockers   Reason not to administer Beta Blockers:Not Applicable, took beta blocker this am

## 2013-08-14 NOTE — Anesthesia Postprocedure Evaluation (Signed)
Anesthesia Post Note  Patient: [name]  Procedure(s) Performed: Procedure(s) (LRB): LAPAROSCOPIC CHOLECYSTECTOMY WITH INTRAOPERATIVE CHOLANGIOGRAM (N/A)  Anesthesia type: General  Patient location: PACU  Post pain: Pain level controlled  Post assessment: Post-op Vital signs reviewed  Last Vitals: BP 137/68  Pulse 56  Temp(Src) 36.5 C (Oral)  Resp 13  SpO2 100%  Post vital signs: Reviewed  Level of consciousness: sedated  Complications: No apparent anesthesia complications

## 2013-08-14 NOTE — Brief Op Note (Signed)
08/14/2013  8:44 AM  PATIENT:  Lenord Fellers  57 y.o. female  PRE-OPERATIVE DIAGNOSIS:  cholelithiasis  POST-OPERATIVE DIAGNOSIS:  cholelithiasis  PROCEDURE:  Procedure(s): LAPAROSCOPIC CHOLECYSTECTOMY WITH INTRAOPERATIVE CHOLANGIOGRAM (N/A)  SURGEON:  Surgeon(s) and Role:    * Lodema Pilot, DO - Primary  PHYSICIAN ASSISTANT:   ASSISTANTS: none   ANESTHESIA:   general  EBL:     BLOOD ADMINISTERED:none  DRAINS: none   LOCAL MEDICATIONS USED:  MARCAINE    and LIDOCAINE   SPECIMEN:  Source of Specimen:  gallbladder  DISPOSITION OF SPECIMEN:  PATHOLOGY  COUNTS:  YES  TOURNIQUET:  * No tourniquets in log *  DICTATION: .Other Dictation: Dictation Number [id number]  PLAN OF CARE: Discharge to home after PACU  PATIENT DISPOSITION:  PACU - hemodynamically stable.   Delay start of Pharmacological VTE agent (>24hrs) due to surgical blood loss or risk of bleeding: no

## 2013-08-14 NOTE — Op Note (Signed)
NAMECALIA, [name] NO.:  [id number]  MEDICAL RECORD NO.:  [id number]  LOCATION:  [facility]                         FACILITY:  [facility]  PHYSICIAN:  Lodema Pilot, MD       DATE OF BIRTH:  11-04-1956  DATE OF PROCEDURE:  08/14/2013 DATE OF DISCHARGE:                              OPERATIVE REPORT   PROCEDURE:  Laparoscopic cholecystectomy with intraoperative cholangiogram.  PREOPERATIVE DIAGNOSIS:  Symptomatic cholelithiasis.  POSTOPERATIVE DIAGNOSIS:  Symptomatic cholelithiasis.  SURGEON:  Lodema Pilot, MD  ASSISTANT:  None.  ANESTHESIA:  General endotracheal tube anesthesia with 45 mL of 1% lidocaine with epinephrine and 0.25% Marcaine in a 50:50 mixture.  FLUIDS:  900 mL of crystalloid.  ESTIMATED BLOOD LOSS:  Minimal.  DRAINS:  None.  SPECIMENS:  Gallbladder and contents sent to Pathology for permanent sectioning.  COMPLICATIONS:  None apparent.  FINDINGS:  Normal-appearing gallbladder anatomy, normal cholangiogram, several gallstones.  INDICATION FOR PROCEDURE:  [name] is a 57 year old female with right upper quadrant and right flank pain and multiple gallstones on ultrasound, and concern for symptomatic cholelithiasis.  OPERATIVE DETAILS:  [name] was seen and evaluated in the preoperative area and risks and benefits of the procedure were again discussed in lay terms.  Informed consent was obtained.  I again discussed with her the possibility of persistent symptoms postoperatively and she expressed understanding and desired to proceed with planned procedure.  She was given prophylactic antibiotics and taken to the operating room, placed on table in supine position, and general endotracheal tube anesthesia was obtained.  Her abdomen was prepped and draped in a standard surgical fashion and procedure time-out was performed with all operative team members confirmed proper patient and procedure.  Then a supraumbilical midline incision was  made in the skin and dissection carried down through subcutaneous tissue using blunt dissection.  The fascia was elevated and sharply incised and the peritoneum entered bluntly.  A 12-mm balloon port was placed at the umbilicus and pneumoperitoneum was obtained.  Laparoscope was introduced.  There was no evidence of bleeding or bowel injury upon entry.  Two 5 mm right upper quadrant ports were placed under direct visualization and then an 11-mm epigastric port was placed as well.  The gallbladder was retracted cephalad.  She had some omental adhesions, which were taken down from the gallbladder using blunt dissection and the gallbladder was then retracted laterally.  The cystic artery was seen in its usual anatomic position just medial to the cystic duct and this was skeletonized and divided.  This allowed me to open up the triangle of Calot and skeletonize the cystic duct to a single small cystic duct.  Clip was placed on the gallbladder site of the duct after the critical view of safety was obtained visualizing the liver parenchyma through the triangle of Calot.  A small cystic ductotomy was made and a cholangiogram catheter passed through the abdominal wall and clipped in the cystic duct and a cholangiogram was performed, which demonstrated normal ductal anatomy with normal right and left hepatic ducts and free flow of bile into the duodenum.  There were no evidence of any common bile duct filling defects.  The catheter was removed  and three clips were placed on the cystic duct stump and the duct was divided.  Then, the gallbladder was removed from the gallbladder fossa using Bovie electrocautery.  The gallbladder was not entered during the adrenal dissection, although there was some leakage of bile from the cystic duct on the gallbladder where the clip did not completely go across the Hartman's pouch, there was no spillage of any stones.  The gallbladder was completely removed from  the gallbladder fossa and placed in an EndoCatch bag and was removed from the umbilical trocar site and sent to Pathology for permanent sectioning.  She did have some palpable gallstones.  The gallbladder fossa was inspected for hemostasis and noted to be adequate.  The right upper quadrant was then irrigated with sterile saline solution until the irrigation returned clear.  The clips were noted to be in good position and there was no evidence of bleeding or bile or bowel injury.  The right upper quadrant ports were removed under direct visualization and the umbilical fascia was approximated with interrupted 0-Vicryl sutures in open fashion.  The abdomen was re- insufflated through the epigastric trocar and the umbilical closure was noted to be adequate without any evidence of bleeding or bowel injury. The right upper quadrant again appeared hemostatic without any evidence of bleeding or bile or bowel injury.  The final port was removed.  The wounds were injected with 45 mL of 1% lidocaine with epinephrine and 0.25% Marcaine in a 50:50 mixture.  The skin edges were approximated with 4-0 Monocryl subcuticular suture.  Skin was washed and dried and Dermabond was applied.  All sponge, needle, and instrument counts were correct at the end of the case.  The patient tolerated procedure well without apparent complications.          ______________________________ Lodema Pilot, MD     BL/MEDQ  D:  08/14/2013  T:  08/14/2013  Job:  [id number]

## 2013-08-17 ENCOUNTER — Encounter (HOSPITAL_COMMUNITY): Payer: Self-pay | Admitting: General Surgery

## 2013-08-28 ENCOUNTER — Encounter (INDEPENDENT_AMBULATORY_CARE_PROVIDER_SITE_OTHER): Payer: Self-pay | Admitting: General Surgery

## 2013-08-28 ENCOUNTER — Encounter (INDEPENDENT_AMBULATORY_CARE_PROVIDER_SITE_OTHER): Payer: Self-pay

## 2013-08-28 ENCOUNTER — Ambulatory Visit (INDEPENDENT_AMBULATORY_CARE_PROVIDER_SITE_OTHER): Payer: 59 | Admitting: General Surgery

## 2013-08-28 VITALS — BP 124/82 | HR 64 | Temp 96.9°F | Resp 14 | Ht 63.0 in | Wt 223.8 lb

## 2013-08-28 DIAGNOSIS — Z5189 Encounter for other specified aftercare: Secondary | ICD-10-CM

## 2013-08-28 DIAGNOSIS — Z4889 Encounter for other specified surgical aftercare: Secondary | ICD-10-CM

## 2013-08-28 NOTE — Progress Notes (Signed)
Subjective:     Patient ID: [name], female   DOB: 27-Aug-1956, 57 y.o.   MRN: [id number]  HPI The patient follows up status post laparoscopic cholecystectomy, she's doing very well from her procedure and says that she feels much better. Her pathology was benign. She says it when she eats and she feels as though the food goes right through her unless she cooks for herself.  She is off her pain medication and has no nausea or vomiting  Review of Systems     Objective:   Physical Exam She is in no acute distress and nontoxic-appearing Her abdomen is soft and nontender on exam her incisions are healing nicely without sign of infection    Assessment:     Status post laparoscopic cholecystectomy-doing well She seems to be recovering nicely from her procedure without any evidence of postoperative complication. She says that she feels better than before her procedure although she still has some right-sided pains when she lays on her side. Think that some of the loose bowels should improve with time and she says that she already is seeing improvement in this. She can gradually increase her activity as tolerated and followup with me when necessary     Plan:     Gradually increase activity as tolerated and follow up with me when necessary

## 2014-04-09 ENCOUNTER — Other Ambulatory Visit: Payer: Self-pay | Admitting: Family Medicine

## 2014-04-09 ENCOUNTER — Other Ambulatory Visit: Payer: Self-pay | Admitting: Nurse Practitioner

## 2014-04-09 DIAGNOSIS — Z1231 Encounter for screening mammogram for malignant neoplasm of breast: Secondary | ICD-10-CM

## 2014-04-19 ENCOUNTER — Ambulatory Visit
Admission: RE | Admit: 2014-04-19 | Discharge: 2014-04-19 | Disposition: A | Discharge status: Home / Self Care | Payer: 59 | Source: Ambulatory Visit | Attending: Nurse Practitioner | Admitting: Nurse Practitioner

## 2014-04-19 DIAGNOSIS — Z1231 Encounter for screening mammogram for malignant neoplasm of breast: Secondary | ICD-10-CM

## 2015-10-22 ENCOUNTER — Other Ambulatory Visit: Payer: Self-pay | Admitting: Nurse Practitioner

## 2015-10-22 DIAGNOSIS — Z1231 Encounter for screening mammogram for malignant neoplasm of breast: Secondary | ICD-10-CM

## 2016-06-15 ENCOUNTER — Ambulatory Visit
Admission: RE | Admit: 2016-06-15 | Discharge: 2016-06-15 | Disposition: A | Discharge status: Home / Self Care | Payer: Self-pay | Source: Ambulatory Visit | Attending: Nurse Practitioner | Admitting: Nurse Practitioner

## 2016-06-15 DIAGNOSIS — Z1231 Encounter for screening mammogram for malignant neoplasm of breast: Secondary | ICD-10-CM

## 2018-10-07 ENCOUNTER — Other Ambulatory Visit: Payer: Self-pay | Admitting: Orthopedic Surgery

## 2018-12-03 ENCOUNTER — Encounter (HOSPITAL_BASED_OUTPATIENT_CLINIC_OR_DEPARTMENT_OTHER): Payer: Self-pay

## 2018-12-03 ENCOUNTER — Other Ambulatory Visit: Payer: Self-pay

## 2018-12-05 ENCOUNTER — Encounter (HOSPITAL_BASED_OUTPATIENT_CLINIC_OR_DEPARTMENT_OTHER)
Admission: RE | Admit: 2018-12-05 | Discharge: 2018-12-05 | Disposition: A | Discharge status: Home / Self Care | Payer: Self-pay | Source: Ambulatory Visit | Attending: Orthopedic Surgery | Admitting: Orthopedic Surgery

## 2018-12-05 ENCOUNTER — Other Ambulatory Visit: Payer: Self-pay

## 2018-12-05 DIAGNOSIS — Z6837 Body mass index (BMI) 37.0-37.9, adult: Secondary | ICD-10-CM | POA: Diagnosis not present

## 2018-12-05 DIAGNOSIS — Z7984 Long term (current) use of oral hypoglycemic drugs: Secondary | ICD-10-CM | POA: Diagnosis not present

## 2018-12-05 DIAGNOSIS — E119 Type 2 diabetes mellitus without complications: Secondary | ICD-10-CM | POA: Diagnosis not present

## 2018-12-05 DIAGNOSIS — Z79899 Other long term (current) drug therapy: Secondary | ICD-10-CM | POA: Diagnosis not present

## 2018-12-05 DIAGNOSIS — M19041 Primary osteoarthritis, right hand: Secondary | ICD-10-CM | POA: Diagnosis not present

## 2018-12-05 DIAGNOSIS — I10 Essential (primary) hypertension: Secondary | ICD-10-CM | POA: Diagnosis not present

## 2018-12-05 DIAGNOSIS — G4730 Sleep apnea, unspecified: Secondary | ICD-10-CM | POA: Diagnosis not present

## 2018-12-05 DIAGNOSIS — M25841 Other specified joint disorders, right hand: Secondary | ICD-10-CM | POA: Diagnosis not present

## 2018-12-05 DIAGNOSIS — E785 Hyperlipidemia, unspecified: Secondary | ICD-10-CM | POA: Diagnosis not present

## 2018-12-05 DIAGNOSIS — K219 Gastro-esophageal reflux disease without esophagitis: Secondary | ICD-10-CM | POA: Diagnosis not present

## 2018-12-05 LAB — BASIC METABOLIC PANEL
Anion gap: 9 (ref 5–15)
BUN: 8 mg/dL (ref 8–23)
CO2: 26 mmol/L (ref 22–32)
Calcium: 9 mg/dL (ref 8.9–10.3)
Chloride: 105 mmol/L (ref 98–111)
Creatinine, Ser: 0.76 mg/dL (ref 0.44–1.00)
GFR calc Af Amer: >60 mL/min (ref >60)
Glucose, Bld: 200 mg/dL — ABNORMAL HIGH (ref 70–99)
Potassium: 4 mmol/L (ref 3.5–5.1)
Sodium: 140 mmol/L (ref 135–145)

## 2018-12-11 ENCOUNTER — Ambulatory Visit (HOSPITAL_BASED_OUTPATIENT_CLINIC_OR_DEPARTMENT_OTHER): Payer: 59 | Admitting: Anesthesiology

## 2018-12-11 ENCOUNTER — Other Ambulatory Visit: Payer: Self-pay

## 2018-12-11 ENCOUNTER — Encounter (HOSPITAL_BASED_OUTPATIENT_CLINIC_OR_DEPARTMENT_OTHER)
Admission: RE | Disposition: A | Discharge status: Home / Self Care | Payer: Self-pay | Source: Ambulatory Visit | Attending: Orthopedic Surgery

## 2018-12-11 ENCOUNTER — Encounter (HOSPITAL_BASED_OUTPATIENT_CLINIC_OR_DEPARTMENT_OTHER): Payer: Self-pay

## 2018-12-11 ENCOUNTER — Ambulatory Visit (HOSPITAL_BASED_OUTPATIENT_CLINIC_OR_DEPARTMENT_OTHER)
Admission: RE | Admit: 2018-12-11 | Discharge: 2018-12-11 | Disposition: A | Discharge status: Home / Self Care | Payer: 59 | Source: Ambulatory Visit | Attending: Orthopedic Surgery | Admitting: Orthopedic Surgery

## 2018-12-11 DIAGNOSIS — E119 Type 2 diabetes mellitus without complications: Secondary | ICD-10-CM | POA: Insufficient documentation

## 2018-12-11 DIAGNOSIS — M19041 Primary osteoarthritis, right hand: Secondary | ICD-10-CM | POA: Insufficient documentation

## 2018-12-11 DIAGNOSIS — Z7984 Long term (current) use of oral hypoglycemic drugs: Secondary | ICD-10-CM | POA: Insufficient documentation

## 2018-12-11 DIAGNOSIS — E785 Hyperlipidemia, unspecified: Secondary | ICD-10-CM | POA: Insufficient documentation

## 2018-12-11 DIAGNOSIS — G4730 Sleep apnea, unspecified: Secondary | ICD-10-CM | POA: Insufficient documentation

## 2018-12-11 DIAGNOSIS — I10 Essential (primary) hypertension: Secondary | ICD-10-CM | POA: Insufficient documentation

## 2018-12-11 DIAGNOSIS — M25841 Other specified joint disorders, right hand: Secondary | ICD-10-CM | POA: Insufficient documentation

## 2018-12-11 DIAGNOSIS — Z79899 Other long term (current) drug therapy: Secondary | ICD-10-CM | POA: Insufficient documentation

## 2018-12-11 DIAGNOSIS — K219 Gastro-esophageal reflux disease without esophagitis: Secondary | ICD-10-CM | POA: Insufficient documentation

## 2018-12-11 DIAGNOSIS — Z6837 Body mass index (BMI) 37.0-37.9, adult: Secondary | ICD-10-CM | POA: Insufficient documentation

## 2018-12-11 HISTORY — PX: MASS EXCISION: SHX2000

## 2018-12-11 HISTORY — DX: Hyperlipidemia, unspecified: E78.5

## 2018-12-11 HISTORY — DX: Dislocation of jaw, unspecified side, initial encounter: S03.00XA

## 2018-12-11 HISTORY — DX: Calculus of gallbladder without cholecystitis without obstruction: K80.20

## 2018-12-11 LAB — GLUCOSE, CAPILLARY
Glucose-Capillary: 139 mg/dL — ABNORMAL HIGH (ref 70–99)
Glucose-Capillary: 112 mg/dL — ABNORMAL HIGH (ref 70–99)

## 2018-12-11 SURGERY — EXCISION MASS
Anesthesia: Monitor Anesthesia Care | Site: Thumb | Laterality: Right

## 2018-12-11 MED ORDER — LIDOCAINE HCL (PF) 0.5 % IJ SOLN
INTRAMUSCULAR | Status: DC | PRN
  Administered 2018-12-11: 30 mL via INTRAVENOUS

## 2018-12-11 MED ORDER — BUPIVACAINE HCL (PF) 0.25 % IJ SOLN
INTRAMUSCULAR | Status: DC | PRN
  Administered 2018-12-11: 8 mL

## 2018-12-11 MED ORDER — PROPOFOL 500 MG/50ML IV EMUL
INTRAVENOUS | Status: DC | PRN
  Administered 2018-12-11: 25 ug/kg/min via INTRAVENOUS

## 2018-12-11 MED ORDER — FENTANYL CITRATE (PF) 100 MCG/2ML IJ SOLN
INTRAMUSCULAR | Status: AC
  Filled 2018-12-11: qty 2

## 2018-12-11 MED ORDER — MIDAZOLAM HCL 2 MG/2ML IJ SOLN
INTRAMUSCULAR | Status: AC
  Filled 2018-12-11: qty 2

## 2018-12-11 MED ORDER — MIDAZOLAM HCL 2 MG/2ML IJ SOLN
INTRAMUSCULAR | Status: DC | PRN
  Administered 2018-12-11: 2 mg via INTRAVENOUS

## 2018-12-11 MED ORDER — FENTANYL CITRATE (PF) 100 MCG/2ML IJ SOLN
INTRAMUSCULAR | Status: DC | PRN
  Administered 2018-12-11 (×2): 50 ug via INTRAVENOUS

## 2018-12-11 MED ORDER — CHLORHEXIDINE GLUCONATE 4 % EX LIQD: 60.0000 mL | Freq: Once | CUTANEOUS | Status: DC

## 2018-12-11 MED ORDER — LACTATED RINGERS IV SOLN
INTRAVENOUS | Status: DC
  Administered 2018-12-11: 08:00:00 via INTRAVENOUS

## 2018-12-11 MED ORDER — ONDANSETRON HCL 4 MG/2ML IJ SOLN
INTRAMUSCULAR | Status: AC
  Filled 2018-12-11: qty 2

## 2018-12-11 MED ORDER — FENTANYL CITRATE (PF) 100 MCG/2ML IJ SOLN: 50.0000 ug | INTRAMUSCULAR | Status: DC | PRN

## 2018-12-11 MED ORDER — HYDROMORPHONE HCL 1 MG/ML IJ SOLN: 0.2500 mg | INTRAMUSCULAR | Status: DC | PRN

## 2018-12-11 MED ORDER — SCOPOLAMINE 1 MG/3DAYS TD PT72: 1.0000 | MEDICATED_PATCH | Freq: Once | TRANSDERMAL | Status: DC | PRN

## 2018-12-11 MED ORDER — ONDANSETRON HCL 4 MG/2ML IJ SOLN
INTRAMUSCULAR | Status: DC | PRN
  Administered 2018-12-11: 4 mg via INTRAVENOUS

## 2018-12-11 MED ORDER — HYDROCODONE-ACETAMINOPHEN 5-325 MG PO TABS: 1.0000 | ORAL_TABLET | Freq: Four times a day (QID) | ORAL | 0 refills | Status: DC | PRN

## 2018-12-11 MED ORDER — LIDOCAINE HCL (CARDIAC) PF 100 MG/5ML IV SOSY
PREFILLED_SYRINGE | INTRAVENOUS | Status: DC | PRN
  Administered 2018-12-11: 20 mg via INTRAVENOUS

## 2018-12-11 MED ORDER — CEFAZOLIN SODIUM-DEXTROSE 2-4 GM/100ML-% IV SOLN
INTRAVENOUS | Status: AC
  Filled 2018-12-11: qty 100

## 2018-12-11 MED ORDER — LIDOCAINE 2% (20 MG/ML) 5 ML SYRINGE
INTRAMUSCULAR | Status: AC
  Filled 2018-12-11: qty 5

## 2018-12-11 MED ORDER — CEFAZOLIN SODIUM-DEXTROSE 2-4 GM/100ML-% IV SOLN
2.0000 g | INTRAVENOUS | Status: AC
  Administered 2018-12-11: 2 g via INTRAVENOUS

## 2018-12-11 MED ORDER — MIDAZOLAM HCL 2 MG/2ML IJ SOLN: 1.0000 mg | INTRAMUSCULAR | Status: DC | PRN

## 2018-12-11 SURGICAL SUPPLY — 42 items
BANDAGE COBAN STERILE 2 (GAUZE/BANDAGES/DRESSINGS) IMPLANT
BLADE SURG 15 STRL LF DISP TIS (BLADE) ×1 IMPLANT
BLADE SURG 15 STRL SS (BLADE) ×2
BNDG CMPR 9X4 STRL LF SNTH (GAUZE/BANDAGES/DRESSINGS)
BNDG COHESIVE 1X5 TAN STRL LF (GAUZE/BANDAGES/DRESSINGS) IMPLANT
BNDG COHESIVE 3X5 TAN STRL LF (GAUZE/BANDAGES/DRESSINGS) IMPLANT
BNDG ESMARK 4X9 LF (GAUZE/BANDAGES/DRESSINGS) IMPLANT
BNDG GAUZE ELAST 4 BULKY (GAUZE/BANDAGES/DRESSINGS) IMPLANT
CHLORAPREP W/TINT 26ML (MISCELLANEOUS) ×2 IMPLANT
CORD BIPOLAR FORCEPS 12FT (ELECTRODE) ×2 IMPLANT
COVER BACK TABLE 60X90IN (DRAPES) ×2 IMPLANT
COVER MAYO STAND STRL (DRAPES) ×2 IMPLANT
COVER WAND RF STERILE (DRAPES) IMPLANT
CUFF TOURNIQUET SINGLE 18IN (TOURNIQUET CUFF) IMPLANT
DECANTER SPIKE VIAL GLASS SM (MISCELLANEOUS) IMPLANT
DRAIN PENROSE 1/2X12 LTX STRL (WOUND CARE) IMPLANT
DRAPE EXTREMITY T 121X128X90 (DRAPE) ×2 IMPLANT
DRAPE SURG 17X23 STRL (DRAPES) ×2 IMPLANT
GAUZE SPONGE 4X4 12PLY STRL (GAUZE/BANDAGES/DRESSINGS) ×2 IMPLANT
GAUZE XEROFORM 1X8 LF (GAUZE/BANDAGES/DRESSINGS) ×2 IMPLANT
GLOVE BIOGEL PI IND STRL 8.5 (GLOVE) ×1 IMPLANT
GLOVE BIOGEL PI INDICATOR 8.5 (GLOVE) ×1
GLOVE SURG ORTHO 8.0 STRL STRW (GLOVE) ×2 IMPLANT
GOWN STRL REUS W/ TWL LRG LVL3 (GOWN DISPOSABLE) ×1 IMPLANT
GOWN STRL REUS W/TWL LRG LVL3 (GOWN DISPOSABLE) ×2
GOWN STRL REUS W/TWL XL LVL3 (GOWN DISPOSABLE) ×2 IMPLANT
NDL SAFETY ECLIPSE 18X1.5 (NEEDLE) IMPLANT
NEEDLE HYPO 18GX1.5 SHARP (NEEDLE)
NEEDLE PRECISIONGLIDE 27X1.5 (NEEDLE) ×2 IMPLANT
NS IRRIG 1000ML POUR BTL (IV SOLUTION) ×2 IMPLANT
PACK BASIN DAY SURGERY FS (CUSTOM PROCEDURE TRAY) ×2 IMPLANT
PAD CAST 3X4 CTTN HI CHSV (CAST SUPPLIES) IMPLANT
PADDING CAST COTTON 3X4 STRL (CAST SUPPLIES)
SPLINT PLASTER CAST XFAST 3X15 (CAST SUPPLIES) IMPLANT
SPLINT PLASTER XTRA FASTSET 3X (CAST SUPPLIES)
STOCKINETTE 4X48 STRL (DRAPES) ×2 IMPLANT
SUT ETHILON 4 0 PS 2 18 (SUTURE) ×2 IMPLANT
SUT VIC AB 4-0 P2 18 (SUTURE) IMPLANT
SYR BULB 3OZ (MISCELLANEOUS) ×2 IMPLANT
SYR CONTROL 10ML LL (SYRINGE) ×2 IMPLANT
TOWEL GREEN STERILE FF (TOWEL DISPOSABLE) ×2 IMPLANT
UNDERPAD 30X30 (UNDERPADS AND DIAPERS) ×2 IMPLANT

## 2018-12-11 NOTE — H&P (Signed)
[name] is an 62 y.o. female.   Chief Complaint: mass right thumb [id number]  is a 62 year old left-hand-dominant female referred by Dr. Loura Back for consultation regarding a mass on the IP joint of her thumb. She has also complaining of catching of her right middle finger is she states she has also sustained an injury to her middle finger nail and now has some deformity to it. States is been going approximately 1 month. She has no history of injury to her thumb. She is states it is sore with a VAS score to 3-4 over 10. She has been taking Aleve which has given her some relief. She has no history of thyroid problems arthritis or gout. She does have a history of diabetes. Family history is positive arthritis negative for diabetes thyroid problems and gout   Past Medical History:  Diagnosis Date  . Bladder mass    needs to have surgery after lap chole for this  . Diabetes mellitus without complication (HCC)   . Fatty liver   . Gallstones   . GERD (gastroesophageal reflux disease)   . Headache(784.0)    rare  . Hyperlipemia   . Hypertension   . Low back pain    no meds  . PONV (postoperative nausea and vomiting)   . Seasonal allergies   . Sleep apnea    mild no cpap  . TMJ (dislocation of temporomandibular joint)    mild    Past Surgical History:  Procedure Laterality Date  . CHOLECYSTECTOMY N/A 08/14/2013   Procedure: LAPAROSCOPIC CHOLECYSTECTOMY WITH INTRAOPERATIVE CHOLANGIOGRAM;  Surgeon: Lodema Pilot, DO;  Location: [facility];  Service: General;  Laterality: N/A;  . COLONOSCOPY    . DILATION AND CURETTAGE OF UTERUS    . NASAL SEPTUM SURGERY  2004  . ROTATOR CUFF REPAIR Left 2002    History reviewed. No pertinent family history. Social History:  reports that she has never smoked. She has never used smokeless tobacco. She reports that she does not drink alcohol or use drugs.  Allergies:  Allergies  Allergen Reactions  . Ace Inhibitors Swelling    Swelling  in face  . Diovan [Valsartan] Swelling    Swelling in face  . Ibuprofen Swelling and Rash    Full body swelling  . Januvia [Sitagliptin] Swelling    Swelling in face    No medications prior to admission.    No results found for this or any previous visit (from the past 48 hour(s)).  No results found.   Pertinent items are noted in HPI.  Height 5\' 3"  (1.6 m), weight 95.3 kg.  General appearance: alert, cooperative and appears stated age Head: Normocephalic, without obvious abnormality Neck: no JVD Resp: clear to auscultation bilaterally Cardio: regular rate and rhythm, S1, S2 normal, no murmur, click, rub or gallop GI: soft, non-tender; bowel sounds normal; no masses,  no organomegaly Extremities: mass right thumb Pulses: 2+ and symmetric Skin: Skin color, texture, turgor normal. No rashes or lesions Neurologic: Grossly normal Incision/Wound: na  Assessment/Plan Assessment:  1. Mucoid cyst, joint  2. Trigger middle finger of right hand  3. Osteoarthritis of finger of right hand    Plan: She would like to have the thumb mass excised. I we have discussed excision of the mucoid cyst on each of the digits along with debridement of the distant phalangeal joint and IP joint of her thumb. This would include the thumb index and middle finger. Pre-peri-and postoperative course are discussed along with risks  and complications. She is aware that there is no guarantee to the surgery the possibility of infection recurrence injury to arteries nerves tendons complete relief of symptoms. She is advised that the arthritis in her thumb basilar joint is probably causing her pain not the cyst per se. She is advised should she develop triggering of her middle finger this could could be released at the same time.She has decided to only have the thumb operated on.  Is scheduled as an outpatient under regional anesthesia for right thumb debridement of joints and cyst excisions.   Cindee SaltGary  Osiah Haring 12/11/2018, 5:19 AM

## 2018-12-11 NOTE — Anesthesia Postprocedure Evaluation (Signed)
Anesthesia Post Note  Patient: [name]  Procedure(s) Performed: EXCISION MUCOID CYST WITH DISTAL INTERPHALNGEAL JOINT [place] (Right Thumb)     Patient location during evaluation: PACU Anesthesia Type: MAC Level of consciousness: awake and alert Pain management: pain level controlled Vital Signs Assessment: post-procedure vital signs reviewed and stable Respiratory status: spontaneous breathing, nonlabored ventilation and respiratory function stable Cardiovascular status: stable and blood pressure returned to baseline Postop Assessment: no apparent nausea or vomiting Anesthetic complications: no    Last Vitals:  Vitals:   12/11/18 0907 12/11/18 0922  BP:  104/88  Pulse: (!) 49 (!) 55  Resp: (!) 25 16  Temp:  36.7 C  SpO2: 99% 100%    Last Pain:  Vitals:   12/11/18 0922  TempSrc:   PainSc: 0-No pain                 Loveta Dellis,W. EDMOND

## 2018-12-11 NOTE — Op Note (Signed)
NAME: Ariel SpillersVanessa A Luna MEDICAL RECORD NO: [id number] DATE OF BIRTH: 07/31/1956 [facility] LOCATION: [facility] PHYSICIAN: Nicki ReaperGARY R. 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   ASSISTANT: none   ANESTHESIA:  Bier block with sedation and Local   INTRAVENOUS FLUIDS:  Per anesthesia flow sheet.   ESTIMATED BLOOD LOSS:  Minimal.   COMPLICATIONS:  None.   SPECIMENS:   Cyst   TOURNIQUET TIME:    Total Tourniquet Time Documented: Forearm (Right) - 20 minutes Total: Forearm (Right) - 20 minutes    DISPOSITION:  Stable to PACU.   INDICATIONS: Patient is a 62 year old female with a large mass of the IP joint of her right thumb.  She has similar cyst on her index middle fingers she has had trigger of her middle finger which has responded to injections.  She is admitted for removal of the cyst debridement of the IP joint index and middle finger cyst with debridement of the joints possible rotation flap.  He has elected in the preoperative area to only have the thumb rated on.  She is aware that there is no guarantee to the surgery the possibility of infection recurrence injury to arteries nerves tendons complete relief of symptoms dystrophy.  The extremity is marked by both patient and surgeon antibiotic given   OPERATIVE COURSE: Patient is brought to the operating room where a forearm-based IV regional anesthetic was carried out without difficulty under the direction the anesthesia department.  She was prepped using ChloraPrep and in supine position with the right arm free.  A three-minute dry time was allowed timeout taken confirming patient procedure.  A metacarpal block was given quarter percent  bupivacaine without epinephrine.  Approximately 8 cc was used.  A curvilinear incision was then made over the IP joint of the thumb carried down through subcutaneous tissue.  He was were electrocauterized with bipolar as necessary.  A large cystic mass was immediately encountered.  With blunt sharp dissection this was dissected free and sent to pathology.  An incision was then made on the radial aspect of the IP joint carried down through the joint lining a hemostatic rondure and house curette were then used to do a synovectomy to remove any osteophytes off from the proximal phalanx.  Specimen was sent to pathology.  The wound was copiously irrigated with saline.  The skin was then closed interrupted 4-0 nylon sutures.  A sterile compressive dressing splint to the thumb applied.  The patient tolerated procedure well was taken to the recovery room for observation in satisfactory condition.  She will be [place].  She will try ibuprofen Tylenol first.  That not be adequate she will have the Norco for breakthrough.   Cindee SaltGary Corena Tilson, MD Electronically signed, 12/11/18

## 2018-12-11 NOTE — Brief Op Note (Signed)
12/11/2018  8:52 AM  PATIENT:  [name]  62 y.o. female  PRE-OPERATIVE DIAGNOSIS:  MUCOID CYST RIGHT Thumb   POST-OPERATIVE DIAGNOSIS:  MUCOID CYST RIGHT Thumb   PROCEDURE:  Procedure(s): EXCISION MUCOID CYST WITH DISTAL INTERPHALNGEAL JOINT ARTHOTOMY (Right)  SURGEON:  Surgeon(s) and Role:    * Cindee SaltKuzma, Vollie Aaron, MD - Primary  PHYSICIAN ASSISTANT:   ASSISTANTS: none   ANESTHESIA:   local, regional and IV sedation  EBL:  0 mL   BLOOD ADMINISTERED:none  DRAINS: none   LOCAL MEDICATIONS USED:  BUPIVICAINE   SPECIMEN:  Excision  DISPOSITION OF SPECIMEN:  PATHOLOGY  COUNTS:  YES  TOURNIQUET:   Total Tourniquet Time Documented: Forearm (Right) - 20 minutes Total: Forearm (Right) - 20 minutes   DICTATION: .Dragon Dictation  PLAN OF CARE: Discharge to home after PACU  PATIENT DISPOSITION:  PACU - hemodynamically stable.

## 2018-12-11 NOTE — Anesthesia Preprocedure Evaluation (Signed)
Anesthesia Evaluation  Patient identified by MRN, date of birth, ID band Patient awake    Reviewed: Allergy & Precautions, H&P , NPO status , Patient's Chart, lab work & pertinent test results, reviewed documented beta blocker date and time   History of Anesthesia Complications (+) PONV  Airway Mallampati: II  TM Distance: >3 FB Neck ROM: Full    Dental no notable dental hx. (+) Teeth Intact, Dental Advisory Given   Pulmonary sleep apnea ,    Pulmonary exam normal breath sounds clear to auscultation       Cardiovascular hypertension, Pt. on medications and Pt. on home beta blockers  Rhythm:Regular Rate:Normal     Neuro/Psych  Headaches, negative psych ROS   GI/Hepatic Neg liver ROS, GERD  Controlled,  Endo/Other  diabetes, Type 2, Oral Hypoglycemic AgentsMorbid obesity  Renal/GU negative Renal ROS  negative genitourinary   Musculoskeletal   Abdominal   Peds  Hematology negative hematology ROS (+)   Anesthesia Other Findings   Reproductive/Obstetrics negative OB ROS                             Anesthesia Physical Anesthesia Plan  ASA: III  Anesthesia Plan: MAC and Bier Block and Bier Block-LIDOCAINE ONLY   Post-op Pain Management:    Induction: Intravenous  PONV Risk Score and Plan: 4 or greater and Ondansetron, Dexamethasone, Propofol infusion and Midazolam  Airway Management Planned: Simple Face Mask  Additional Equipment:   Intra-op Plan:   Post-operative Plan:   Informed Consent: I have reviewed the patients History and Physical, chart, labs and discussed the procedure including the risks, benefits and alternatives for the proposed anesthesia with the patient or authorized representative who has indicated his/her understanding and acceptance.   Dental advisory given  Plan Discussed with: CRNA  Anesthesia Plan Comments:         Anesthesia Quick Evaluation

## 2018-12-11 NOTE — Discharge Instructions (Addendum)

## 2018-12-11 NOTE — Transfer of Care (Signed)
Immediate Anesthesia Transfer of Care Note  Patient: [name]  Procedure(s) Performed: EXCISION MUCOID CYST WITH DISTAL INTERPHALNGEAL JOINT ARTHOTOMY (Right Thumb)  Patient Location: PACU  Anesthesia Type:Bier block  Level of Consciousness: awake, alert , oriented and patient cooperative  Airway & Oxygen Therapy: Patient Spontanous Breathing  Post-op Assessment: Report given to RN and Post -op Vital signs reviewed and stable  Post vital signs: Reviewed and stable  Last Vitals:  Vitals Value Taken Time  BP 142/70 12/11/2018  8:53 AM  Temp    Pulse 52 12/11/2018  8:55 AM  Resp 17 12/11/2018  8:55 AM  SpO2 100 % 12/11/2018  8:55 AM  Vitals shown include unvalidated device data.  Last Pain:  Vitals:   12/11/18 0731  TempSrc: Oral  PainSc: 0-No pain         Complications: No apparent anesthesia complications

## 2018-12-12 ENCOUNTER — Encounter (HOSPITAL_BASED_OUTPATIENT_CLINIC_OR_DEPARTMENT_OTHER): Payer: Self-pay | Admitting: Orthopedic Surgery

## 2019-01-16 ENCOUNTER — Other Ambulatory Visit: Payer: Self-pay | Admitting: Nurse Practitioner

## 2019-01-16 DIAGNOSIS — Z1231 Encounter for screening mammogram for malignant neoplasm of breast: Secondary | ICD-10-CM

## 2019-01-19 ENCOUNTER — Ambulatory Visit
Admission: RE | Admit: 2019-01-19 | Discharge: 2019-01-19 | Disposition: A | Discharge status: Home / Self Care | Payer: 59 | Source: Ambulatory Visit | Attending: Nurse Practitioner | Admitting: Nurse Practitioner

## 2019-01-19 DIAGNOSIS — Z1231 Encounter for screening mammogram for malignant neoplasm of breast: Secondary | ICD-10-CM

## 2020-10-10 ENCOUNTER — Other Ambulatory Visit: Payer: Self-pay

## 2020-10-10 ENCOUNTER — Ambulatory Visit
Admission: RE | Admit: 2020-10-10 | Discharge: 2020-10-10 | Disposition: A | Discharge status: Home / Self Care | Payer: 59 | Source: Ambulatory Visit | Attending: Allergy | Admitting: Allergy

## 2020-10-10 ENCOUNTER — Other Ambulatory Visit: Payer: Self-pay | Admitting: Allergy

## 2020-10-10 DIAGNOSIS — R059 Cough, unspecified: Secondary | ICD-10-CM

## 2020-12-26 ENCOUNTER — Other Ambulatory Visit: Payer: Self-pay | Admitting: Nurse Practitioner

## 2020-12-26 DIAGNOSIS — Z1231 Encounter for screening mammogram for malignant neoplasm of breast: Secondary | ICD-10-CM

## 2021-02-02 ENCOUNTER — Ambulatory Visit
Admission: RE | Admit: 2021-02-02 | Discharge: 2021-02-02 | Disposition: A | Discharge status: Home / Self Care | Payer: 59 | Source: Ambulatory Visit | Attending: Nurse Practitioner | Admitting: Nurse Practitioner

## 2021-02-02 ENCOUNTER — Other Ambulatory Visit: Payer: Self-pay

## 2021-02-02 DIAGNOSIS — Z1231 Encounter for screening mammogram for malignant neoplasm of breast: Secondary | ICD-10-CM

## 2022-04-22 IMAGING — MG MM DIGITAL SCREENING BILAT W/ TOMO AND CAD
8 series · 8 of 24 positions shown · non-contrast
Comparison: Previous exam(s).

ACR Breast Density Category a: The breast tissue is almost entirely
fatty.

CLINICAL DATA: Screening.

EXAM:
DIGITAL SCREENING BILATERAL MAMMOGRAM WITH TOMOSYNTHESIS AND CAD

[R CC synth-2D]
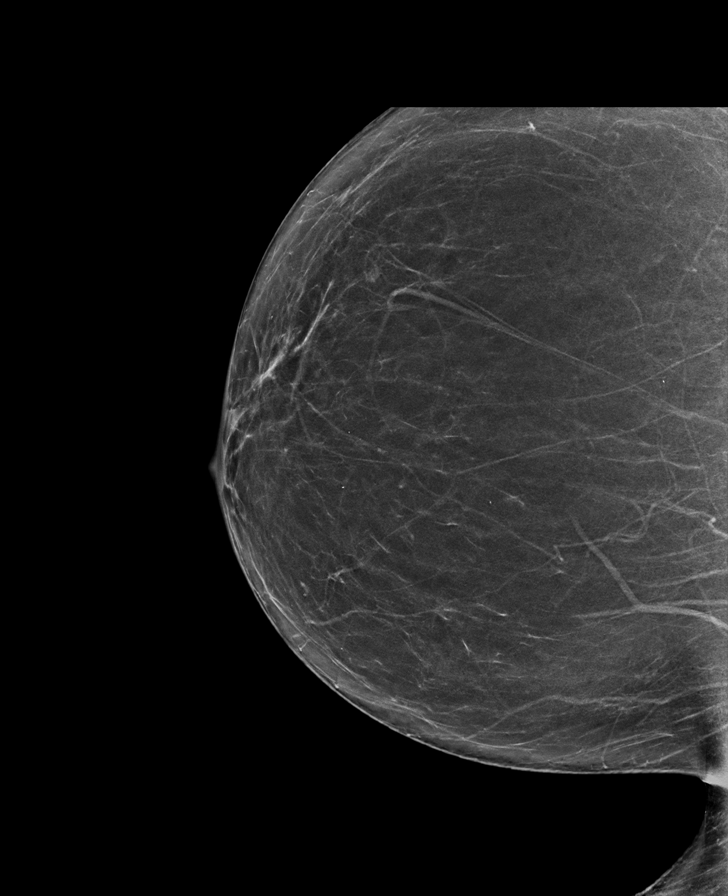

[R MLO synth-2D]
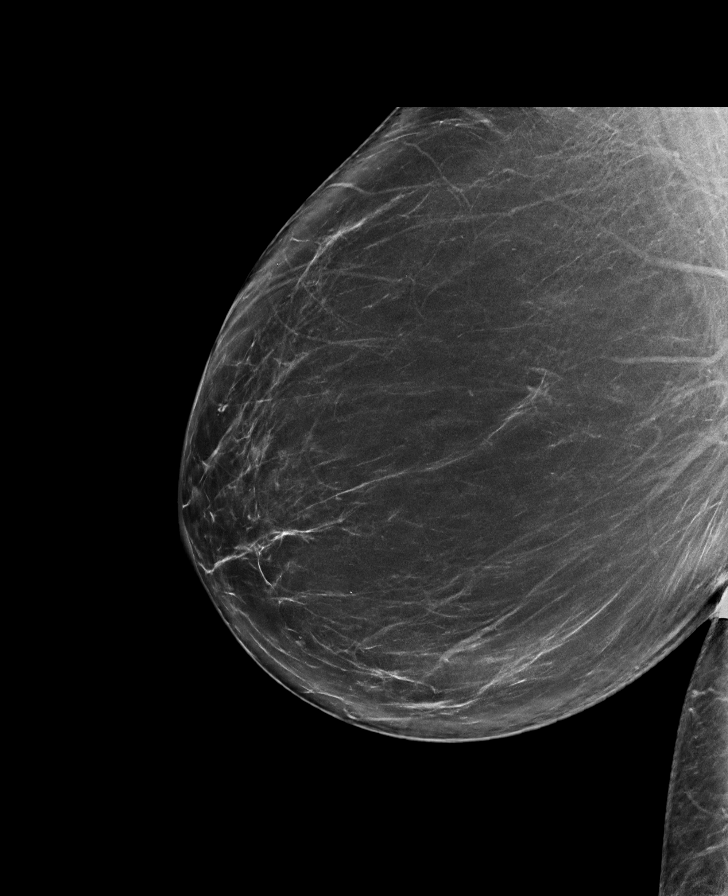

[L CC synth-2D]
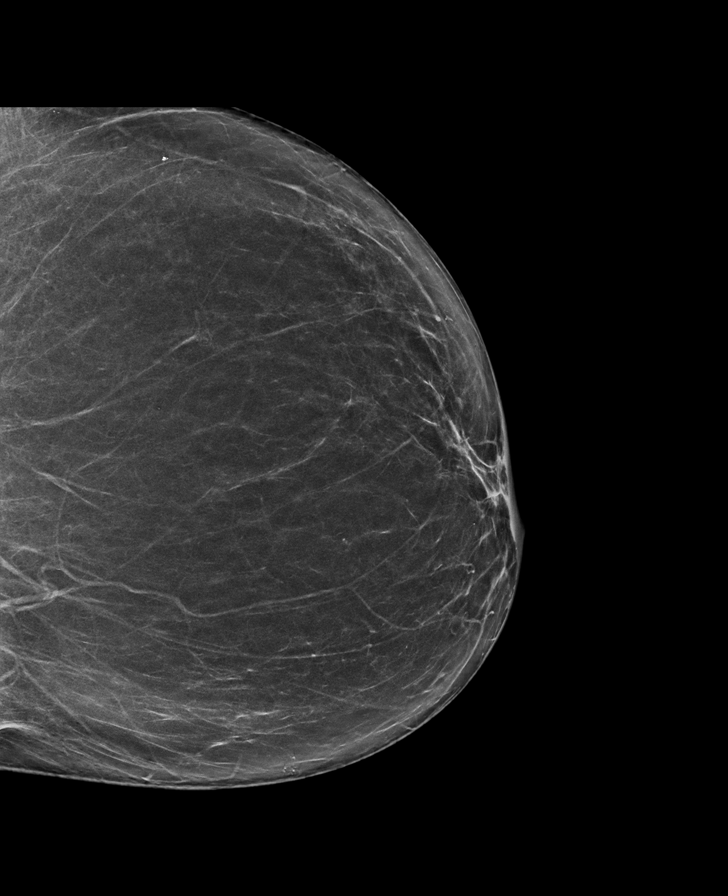

[L MLO synth-2D]
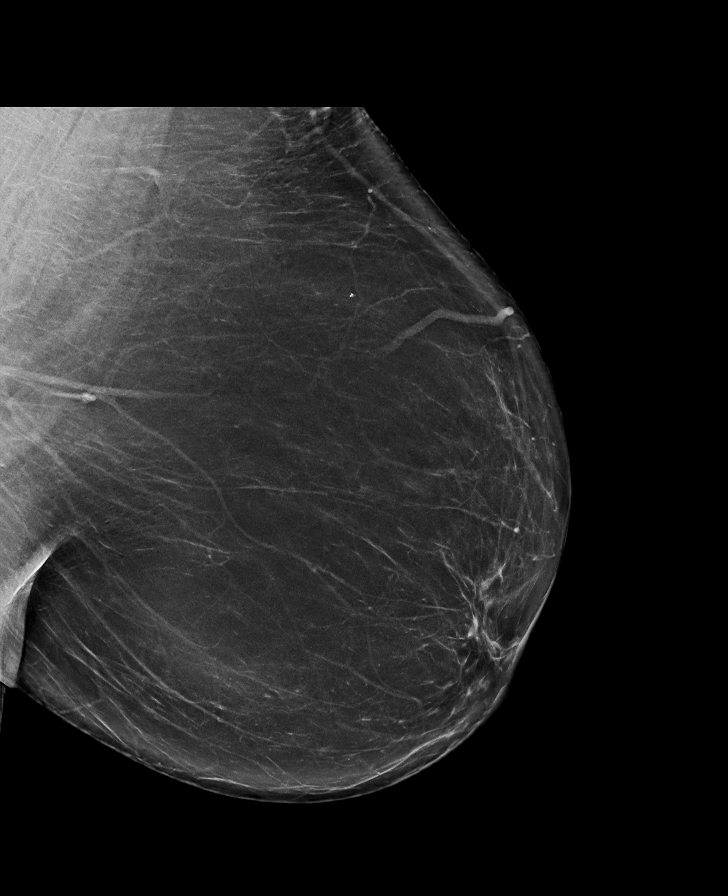

[L CC tomo · tomo slice 37/74.0]
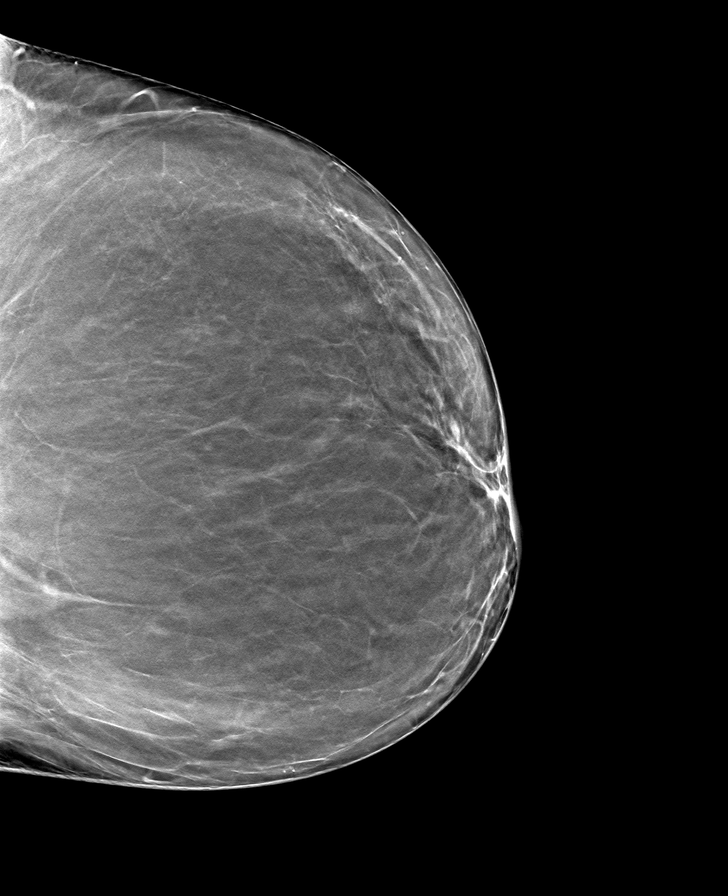

[L MLO tomo · tomo slice 46/91.0]
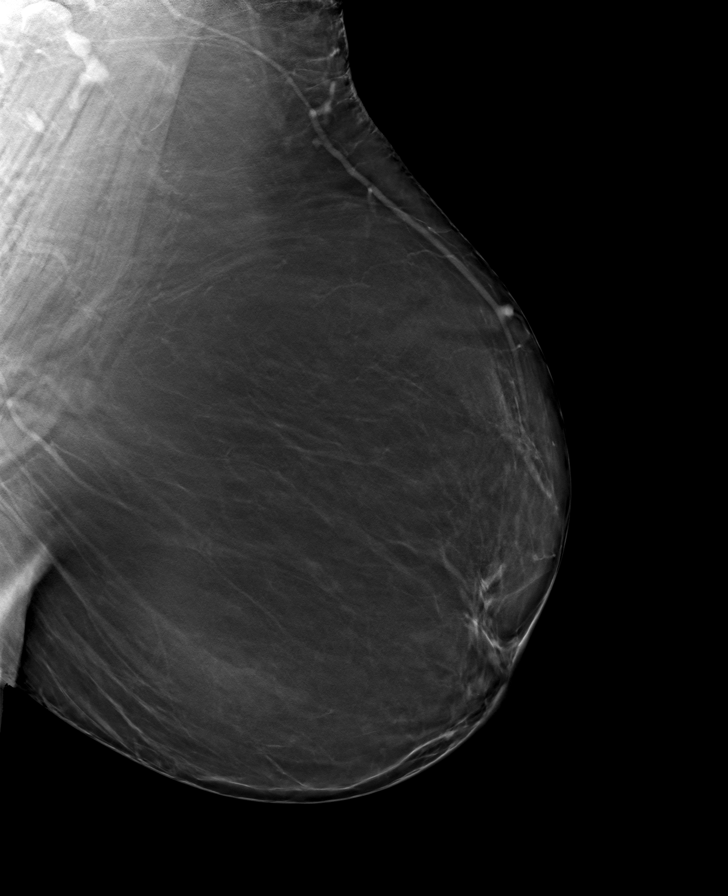

[R MLO tomo · tomo slice 44/87.0]
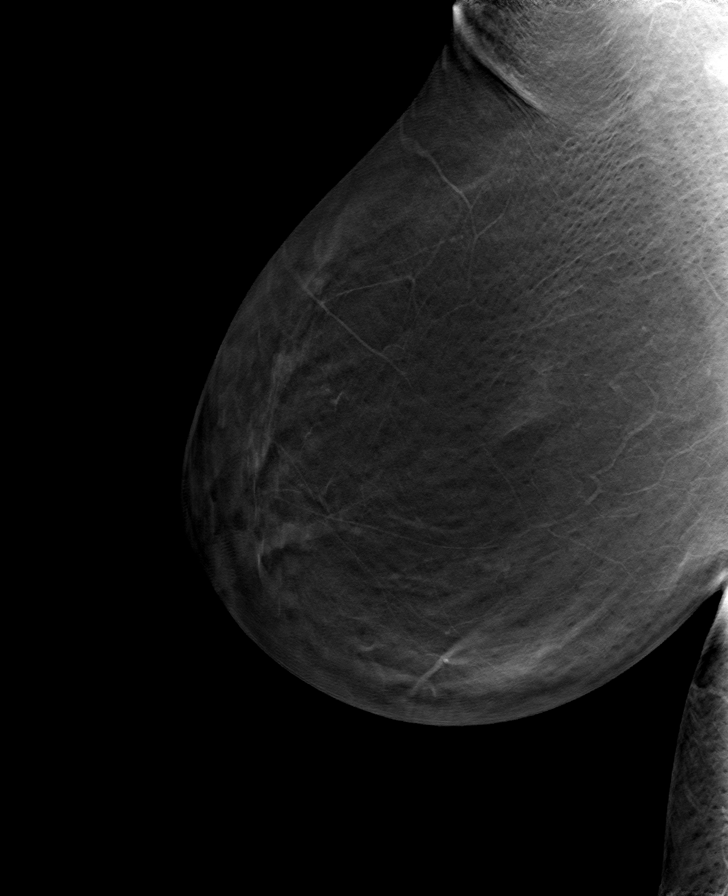

[R CC tomo · tomo slice 37/73.0]
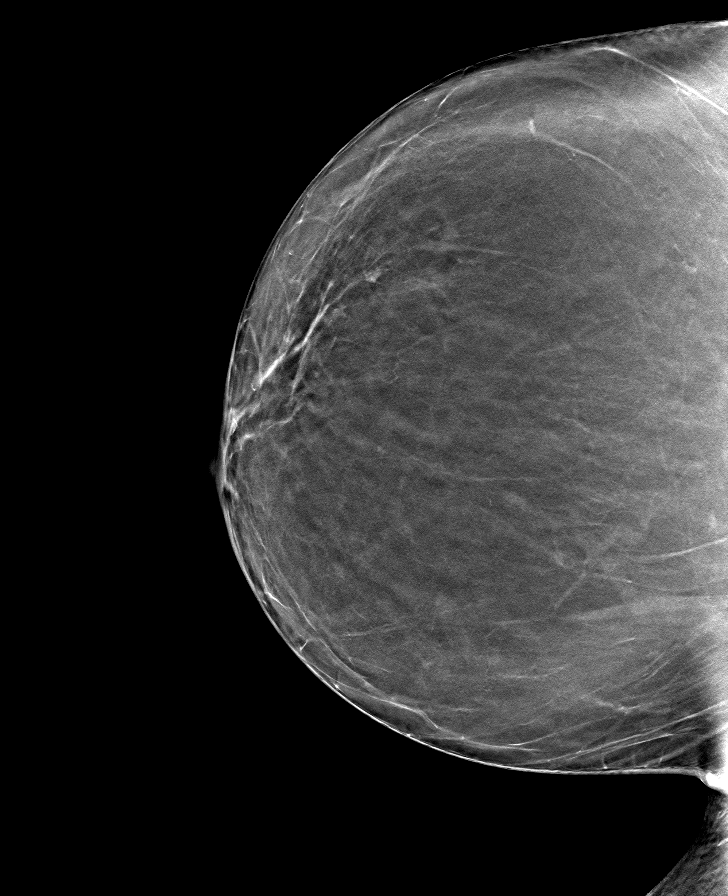

[8 of 24 positions shown; findings below may reference images not displayed]

FINDINGS: There are no findings suspicious for malignancy. The images were
evaluated with computer-aided detection.
IMPRESSION: No mammographic evidence of malignancy. A result letter of this
screening mammogram will be mailed directly to the patient.

RECOMMENDATION:
Screening mammogram in one year. (Code:[id number]-Z-9QQ)

BI-RADS CATEGORY  1: Negative.

## 2022-10-24 ENCOUNTER — Other Ambulatory Visit: Payer: Self-pay | Admitting: Nurse Practitioner

## 2022-10-24 DIAGNOSIS — Z1231 Encounter for screening mammogram for malignant neoplasm of breast: Secondary | ICD-10-CM

## 2022-10-30 ENCOUNTER — Ambulatory Visit
Admission: RE | Admit: 2022-10-30 | Discharge: 2022-10-30 | Disposition: A | Discharge status: Home / Self Care | Payer: 59 | Source: Ambulatory Visit | Attending: Nurse Practitioner | Admitting: Nurse Practitioner

## 2022-10-30 DIAGNOSIS — Z1231 Encounter for screening mammogram for malignant neoplasm of breast: Secondary | ICD-10-CM

## 2024-03-19 ENCOUNTER — Other Ambulatory Visit: Payer: Self-pay | Admitting: Orthopedic Surgery

## 2024-04-30 ENCOUNTER — Other Ambulatory Visit: Payer: Self-pay

## 2024-04-30 ENCOUNTER — Encounter (HOSPITAL_BASED_OUTPATIENT_CLINIC_OR_DEPARTMENT_OTHER): Payer: Self-pay | Admitting: Orthopedic Surgery

## 2024-04-30 NOTE — Progress Notes (Signed)
   04/30/24 1527  PAT Phone Screen  Is the patient taking a GLP-1 receptor agonist? No  Do You Have Diabetes? (S)  Yes  Do You Have Hypertension? (S)  Yes  Have You Ever Been to the ER for Asthma? No  Have You Taken Oral Steroids in the Past 3 Months? No  Do you Take Phenteramine or any Other Diet Drugs? No  Recent  Lab Work, EKG, CXR? No  Do you have a history of heart problems? No  Any Recent Hospitalizations? No  Height 5\' 3"  (1.6 m)  Weight 90.7 kg  Pat Appointment Scheduled Yes (BMP, EKG)

## 2024-05-04 ENCOUNTER — Encounter (HOSPITAL_BASED_OUTPATIENT_CLINIC_OR_DEPARTMENT_OTHER)
Admission: RE | Admit: 2024-05-04 | Discharge: 2024-05-04 | Disposition: A | Discharge status: Home / Self Care | Source: Ambulatory Visit | Attending: Orthopedic Surgery | Admitting: Orthopedic Surgery

## 2024-05-04 DIAGNOSIS — Z01818 Encounter for other preprocedural examination: Secondary | ICD-10-CM | POA: Insufficient documentation

## 2024-05-04 DIAGNOSIS — E119 Type 2 diabetes mellitus without complications: Secondary | ICD-10-CM | POA: Diagnosis not present

## 2024-05-04 DIAGNOSIS — Z794 Long term (current) use of insulin: Secondary | ICD-10-CM | POA: Insufficient documentation

## 2024-05-04 DIAGNOSIS — Z01812 Encounter for preprocedural laboratory examination: Secondary | ICD-10-CM | POA: Diagnosis present

## 2024-05-04 DIAGNOSIS — Z01810 Encounter for preprocedural cardiovascular examination: Secondary | ICD-10-CM | POA: Diagnosis present

## 2024-05-04 LAB — BASIC METABOLIC PANEL WITH GFR
Anion gap: 15 (ref 5–15)
BUN: 10 mg/dL (ref 8–23)
CO2: 22 mmol/L (ref 22–32)
Calcium: 9.3 mg/dL (ref 8.9–10.3)
Chloride: 101 mmol/L (ref 98–111)
Creatinine, Ser: 0.81 mg/dL (ref 0.44–1.00)
GFR, Estimated: >60 mL/min (ref >60)
Glucose, Bld: 189 mg/dL — ABNORMAL HIGH (ref 70–99)
Potassium: 4.1 mmol/L (ref 3.5–5.1)
Sodium: 138 mmol/L (ref 135–145)

## 2024-05-04 NOTE — Progress Notes (Signed)

## 2024-05-05 NOTE — Anesthesia Preprocedure Evaluation (Signed)
 Anesthesia Evaluation  Patient identified by MRN, date of birth, ID band Patient awake    Reviewed: Allergy & Precautions, NPO status , Patient's Chart, lab work & pertinent test results  History of Anesthesia Complications (+) PONV and history of anesthetic complications  Airway Mallampati: II  TM Distance: >3 FB Neck ROM: Full   Comment: Previous grade I view with [facility]  (+) Dental Advisory Given   Pulmonary neg shortness of breath, asthma (last used inhaler ~2 months ago) , sleep apnea (mild, does not use CPAP) , neg COPD, neg recent URI   Pulmonary exam normal breath sounds clear to auscultation       Cardiovascular hypertension (metoprolol ), Pt. on home beta blockers (-) angina (-) Past MI, (-) Cardiac Stents and (-) CABG (-) dysrhythmias  Rhythm:Regular Rate:Normal  HLD   Neuro/Psych  Headaches, neg Seizures    GI/Hepatic ,GERD  ,,Fatty liver   Endo/Other  diabetes, Type 2, Oral Hypoglycemic Agents    Renal/GU negative Renal ROS   H/o bladder mass    Musculoskeletal   Abdominal  (+) + obese  Peds  Hematology negative hematology ROS (+)   Anesthesia Other Findings Mild TMJ - reports no longer an issue  Reproductive/Obstetrics                             Anesthesia Physical Anesthesia Plan  ASA: 3  Anesthesia Plan: Bier Block and MAC and Bier Block-Lidocaine  Only   Post-op Pain Management:    Induction: Intravenous  PONV Risk Score and Plan: 3 and Ondansetron , Propofol  infusion and Treatment may vary due to age or medical condition  Airway Management Planned: Natural Airway and Simple Face Mask  Additional Equipment:   Intra-op Plan:   Post-operative Plan:   Informed Consent: I have reviewed the patients History and Physical, chart, labs and discussed the procedure including the risks, benefits and alternatives for the proposed anesthesia with the patient or  authorized representative who has indicated his/her understanding and acceptance.     Dental advisory given  Plan Discussed with: CRNA and Anesthesiologist  Anesthesia Plan Comments: (Discussed with patient risks of MAC including, but not limited to, minor pain or discomfort, hearing people in the room, and possible need for backup general anesthesia. Risks for general anesthesia also discussed including, but not limited to, sore throat, hoarse voice, chipped/damaged teeth, injury to vocal cords, nausea and vomiting, allergic reactions, lung infection, heart attack, stroke, and death. All questions answered. )        Anesthesia Quick Evaluation

## 2024-05-07 ENCOUNTER — Ambulatory Visit (HOSPITAL_BASED_OUTPATIENT_CLINIC_OR_DEPARTMENT_OTHER): Admitting: Anesthesiology

## 2024-05-07 ENCOUNTER — Encounter (HOSPITAL_BASED_OUTPATIENT_CLINIC_OR_DEPARTMENT_OTHER)
Admission: RE | Disposition: A | Discharge status: Home / Self Care | Payer: Self-pay | Source: Home / Self Care | Attending: Orthopedic Surgery

## 2024-05-07 ENCOUNTER — Ambulatory Visit (HOSPITAL_BASED_OUTPATIENT_CLINIC_OR_DEPARTMENT_OTHER)
Admission: RE | Admit: 2024-05-07 | Discharge: 2024-05-07 | Disposition: A | Discharge status: Home / Self Care | Attending: Orthopedic Surgery | Admitting: Orthopedic Surgery

## 2024-05-07 ENCOUNTER — Encounter (HOSPITAL_BASED_OUTPATIENT_CLINIC_OR_DEPARTMENT_OTHER): Payer: Self-pay | Admitting: Orthopedic Surgery

## 2024-05-07 ENCOUNTER — Other Ambulatory Visit: Payer: Self-pay

## 2024-05-07 DIAGNOSIS — J45909 Unspecified asthma, uncomplicated: Secondary | ICD-10-CM | POA: Insufficient documentation

## 2024-05-07 DIAGNOSIS — M6740 Ganglion, unspecified site: Secondary | ICD-10-CM | POA: Diagnosis present

## 2024-05-07 DIAGNOSIS — M67441 Ganglion, right hand: Secondary | ICD-10-CM | POA: Diagnosis not present

## 2024-05-07 DIAGNOSIS — G4730 Sleep apnea, unspecified: Secondary | ICD-10-CM | POA: Insufficient documentation

## 2024-05-07 DIAGNOSIS — M19041 Primary osteoarthritis, right hand: Secondary | ICD-10-CM

## 2024-05-07 DIAGNOSIS — Z6834 Body mass index (BMI) 34.0-34.9, adult: Secondary | ICD-10-CM | POA: Insufficient documentation

## 2024-05-07 DIAGNOSIS — I10 Essential (primary) hypertension: Secondary | ICD-10-CM | POA: Diagnosis not present

## 2024-05-07 DIAGNOSIS — K760 Fatty (change of) liver, not elsewhere classified: Secondary | ICD-10-CM | POA: Insufficient documentation

## 2024-05-07 DIAGNOSIS — E785 Hyperlipidemia, unspecified: Secondary | ICD-10-CM | POA: Diagnosis not present

## 2024-05-07 DIAGNOSIS — Z7951 Long term (current) use of inhaled steroids: Secondary | ICD-10-CM | POA: Insufficient documentation

## 2024-05-07 DIAGNOSIS — Z79899 Other long term (current) drug therapy: Secondary | ICD-10-CM | POA: Insufficient documentation

## 2024-05-07 DIAGNOSIS — Z7984 Long term (current) use of oral hypoglycemic drugs: Secondary | ICD-10-CM | POA: Diagnosis not present

## 2024-05-07 DIAGNOSIS — E119 Type 2 diabetes mellitus without complications: Secondary | ICD-10-CM | POA: Insufficient documentation

## 2024-05-07 DIAGNOSIS — R519 Headache, unspecified: Secondary | ICD-10-CM | POA: Diagnosis not present

## 2024-05-07 DIAGNOSIS — E669 Obesity, unspecified: Secondary | ICD-10-CM | POA: Diagnosis not present

## 2024-05-07 DIAGNOSIS — K219 Gastro-esophageal reflux disease without esophagitis: Secondary | ICD-10-CM | POA: Insufficient documentation

## 2024-05-07 HISTORY — PX: ARTHROTOMY: SHX5728

## 2024-05-07 HISTORY — PX: CYST REMOVAL HAND: SHX6279

## 2024-05-07 LAB — GLUCOSE, CAPILLARY
Glucose-Capillary: 120 mg/dL — ABNORMAL HIGH (ref 70–99)
Glucose-Capillary: 83 mg/dL (ref 70–99)

## 2024-05-07 SURGERY — REMOVAL, CYST, HAND
Anesthesia: Monitor Anesthesia Care | Site: Middle Finger | Laterality: Right

## 2024-05-07 MED ORDER — ONDANSETRON HCL 4 MG/2ML IJ SOLN
INTRAMUSCULAR | Status: DC | PRN
  Administered 2024-05-07: 4 mg via INTRAVENOUS

## 2024-05-07 MED ORDER — PROPOFOL 500 MG/50ML IV EMUL
INTRAVENOUS | Status: DC | PRN
  Administered 2024-05-07: 50 ug/kg/min via INTRAVENOUS

## 2024-05-07 MED ORDER — SODIUM CHLORIDE 0.9% FLUSH: 3.0000 mL | INTRAVENOUS | Status: DC | PRN

## 2024-05-07 MED ORDER — ACETAMINOPHEN 500 MG PO TABS
1000.0000 mg | ORAL_TABLET | Freq: Once | ORAL | Status: AC
Start: 2024-05-07 — End: 2024-05-07
  Administered 2024-05-07: 1000 mg via ORAL

## 2024-05-07 MED ORDER — FENTANYL CITRATE (PF) 100 MCG/2ML IJ SOLN
INTRAMUSCULAR | Status: AC
  Filled 2024-05-07: qty 2

## 2024-05-07 MED ORDER — CEFAZOLIN SODIUM-DEXTROSE 2-4 GM/100ML-% IV SOLN
2.0000 g | INTRAVENOUS | Status: AC
Start: 2024-05-07 — End: 2024-05-07
  Administered 2024-05-07: 2 g via INTRAVENOUS

## 2024-05-07 MED ORDER — 0.9 % SODIUM CHLORIDE (POUR BTL) OPTIME
TOPICAL | Status: DC | PRN
Start: 2024-05-07 — End: 2024-05-07
  Administered 2024-05-07: 100 mL

## 2024-05-07 MED ORDER — MIDAZOLAM HCL 2 MG/2ML IJ SOLN
INTRAMUSCULAR | Status: AC
  Filled 2024-05-07: qty 2

## 2024-05-07 MED ORDER — ACETAMINOPHEN 500 MG PO TABS
ORAL_TABLET | ORAL | Status: AC
  Filled 2024-05-07: qty 2

## 2024-05-07 MED ORDER — CEFAZOLIN SODIUM-DEXTROSE 2-4 GM/100ML-% IV SOLN
INTRAVENOUS | Status: AC
  Filled 2024-05-07: qty 100

## 2024-05-07 MED ORDER — LIDOCAINE HCL (PF) 0.5 % IJ SOLN
INTRAMUSCULAR | Status: DC | PRN
  Administered 2024-05-07: 30 mL via INTRAVENOUS

## 2024-05-07 MED ORDER — ONDANSETRON HCL 4 MG/2ML IJ SOLN
INTRAMUSCULAR | Status: AC
  Filled 2024-05-07: qty 2

## 2024-05-07 MED ORDER — BUPIVACAINE HCL (PF) 0.25 % IJ SOLN
INTRAMUSCULAR | Status: DC | PRN
  Administered 2024-05-07: 9 mL

## 2024-05-07 MED ORDER — SODIUM CHLORIDE 0.9% FLUSH
3.0000 mL | Freq: Two times a day (BID) | INTRAVENOUS | Status: DC
Start: 2024-05-07 — End: 2024-05-07

## 2024-05-07 MED ORDER — LACTATED RINGERS IV SOLN: INTRAVENOUS | Status: DC | PRN

## 2024-05-07 MED ORDER — PROPOFOL 10 MG/ML IV BOLUS
INTRAVENOUS | Status: DC | PRN
Start: 2024-05-07 — End: 2024-05-07
  Administered 2024-05-07 (×3): 10 mg via INTRAVENOUS

## 2024-05-07 MED ORDER — MIDAZOLAM HCL 5 MG/5ML IJ SOLN
INTRAMUSCULAR | Status: DC | PRN
  Administered 2024-05-07: 2 mg via INTRAVENOUS

## 2024-05-07 SURGICAL SUPPLY — 40 items
BANDAGE GAUZE 1X75IN STRL (MISCELLANEOUS) IMPLANT
BENZOIN TINCTURE PRP APPL 2/3 (GAUZE/BANDAGES/DRESSINGS) IMPLANT
BLADE MINI RND TIP GREEN BEAV (BLADE) IMPLANT
BLADE SURG 15 STRL LF DISP TIS (BLADE) ×2 IMPLANT
BNDG COHESIVE 1X5 TAN STRL LF (GAUZE/BANDAGES/DRESSINGS) IMPLANT
BNDG COHESIVE 2X5 TAN ST LF (GAUZE/BANDAGES/DRESSINGS) IMPLANT
BNDG ELASTIC 2INX 5YD STR LF (GAUZE/BANDAGES/DRESSINGS) IMPLANT
BNDG ELASTIC 3INX 5YD STR LF (GAUZE/BANDAGES/DRESSINGS) IMPLANT
BNDG ESMARK 4X9 LF (GAUZE/BANDAGES/DRESSINGS) IMPLANT
BNDG GAUZE DERMACEA FLUFF 4 (GAUZE/BANDAGES/DRESSINGS) IMPLANT
BNDG PLASTER X FAST 3X3 WHT LF (CAST SUPPLIES) IMPLANT
CHLORAPREP W/TINT 26 (MISCELLANEOUS) ×1 IMPLANT
CORD BIPOLAR FORCEPS 12FT (ELECTRODE) ×1 IMPLANT
COVER BACK TABLE 60X90IN (DRAPES) ×1 IMPLANT
COVER MAYO STAND STRL (DRAPES) ×1 IMPLANT
CUFF TOURN SGL QUICK 18X4 (TOURNIQUET CUFF) ×1 IMPLANT
DRAPE EXTREMITY T 121X128X90 (DISPOSABLE) ×1 IMPLANT
DRAPE SURG 17X23 STRL (DRAPES) ×1 IMPLANT
GAUZE SPONGE 4X4 12PLY STRL (GAUZE/BANDAGES/DRESSINGS) ×1 IMPLANT
GAUZE STRETCH 2X75IN STRL (MISCELLANEOUS) IMPLANT
GAUZE XEROFORM 1X8 LF (GAUZE/BANDAGES/DRESSINGS) ×1 IMPLANT
GLOVE BIO SURGEON STRL SZ7.5 (GLOVE) ×1 IMPLANT
GLOVE BIOGEL PI IND STRL 8 (GLOVE) ×1 IMPLANT
GOWN STRL REUS W/ TWL LRG LVL3 (GOWN DISPOSABLE) ×1 IMPLANT
NDL HYPO 25X1 1.5 SAFETY (NEEDLE) ×1 IMPLANT
NEEDLE HYPO 25X1 1.5 SAFETY (NEEDLE) ×1 IMPLANT
NS IRRIG 1000ML POUR BTL (IV SOLUTION) ×1 IMPLANT
PACK BASIN DAY SURGERY FS (CUSTOM PROCEDURE TRAY) ×1 IMPLANT
PAD CAST 3X4 CTTN HI CHSV (CAST SUPPLIES) IMPLANT
PAD CAST 4YDX4 CTTN HI CHSV (CAST SUPPLIES) IMPLANT
PADDING CAST ABS COTTON 4X4 ST (CAST SUPPLIES) ×1 IMPLANT
SPLINT FINGER 3.25 911903 (SOFTGOODS) IMPLANT
STOCKINETTE 4X48 STRL (DRAPES) ×1 IMPLANT
STRIP CLOSURE SKIN 1/2X4 (GAUZE/BANDAGES/DRESSINGS) IMPLANT
SUT ETHILON 3 0 PS 1 (SUTURE) IMPLANT
SUT ETHILON 4 0 PS 2 18 (SUTURE) ×1 IMPLANT
SYR BULB EAR ULCER 3OZ GRN STR (SYRINGE) ×1 IMPLANT
SYR CONTROL 10ML LL (SYRINGE) ×1 IMPLANT
TOWEL GREEN STERILE FF (TOWEL DISPOSABLE) ×2 IMPLANT
UNDERPAD 30X36 HEAVY ABSORB (UNDERPADS AND DIAPERS) ×1 IMPLANT

## 2024-05-07 NOTE — H&P (Signed)
 [name] is an 68 y.o. female.   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  This is bothersome to her.  She wishes to proceed with excision of the cyst and debridement of the dip joint to try to prevent recurrence.  Allergies:  Allergies  Allergen Reactions   Ace Inhibitors Swelling    Swelling in face   Diovan [Valsartan] Swelling    Swelling in face   Ibuprofen Swelling and Rash    Full body swelling   Januvia [Sitagliptin] Swelling    Swelling in face   No [employer] Allergies Anaphylaxis    Fish and all shellfish (mouth and throat swelling)    Past Medical History:  Diagnosis Date   Bladder mass    needs to have surgery after lap chole for this   Diabetes mellitus without complication (HCC)    Fatty liver    Gallstones    GERD (gastroesophageal reflux disease)    Headache(784.0)    rare   Hyperlipemia    Hypertension    Low back pain    no meds   PONV (postoperative nausea and vomiting)    Seasonal allergies    Sleep apnea    mild no cpap   TMJ (dislocation of temporomandibular joint)    mild    Past Surgical History:  Procedure Laterality Date   CHOLECYSTECTOMY N/A 08/14/2013   Procedure: LAPAROSCOPIC CHOLECYSTECTOMY WITH INTRAOPERATIVE CHOLANGIOGRAM;  Surgeon: Evander Hills, DO;  Location: [facility];  Service: General;  Laterality: N/A;   COLONOSCOPY     DILATION AND CURETTAGE OF UTERUS     MASS EXCISION Right 12/11/2018   Procedure: EXCISION MUCOID CYST WITH DISTAL INTERPHALNGEAL JOINT ARTHOTOMY;  Surgeon: Lyanne Sample, MD;  Location: [facility];  Service: Orthopedics;  Laterality: Right;   NASAL SEPTUM SURGERY  2004   ROTATOR CUFF REPAIR Left 2002    Family History: History reviewed. No pertinent family history.  Social History:   reports that she has never smoked. She has never used smokeless tobacco. She reports that she does not drink alcohol and does not use  drugs.  Medications: Medications Prior to Admission  Medication Sig Dispense Refill   atorvastatin (LIPITOR) 10 MG tablet Take 10 mg by mouth daily.     Canagliflozin-metFORMIN HCl (INVOKAMET) [phone] MG TABS Take 150-1,000 mg by mouth daily.     glimepiride (AMARYL) 4 MG tablet Take 4 mg by mouth daily before breakfast.     metoprolol  succinate (TOPROL -XL) 50 MG 24 hr tablet Take 50 mg by mouth at bedtime. Take with or immediately following a meal.     montelukast (SINGULAIR) 10 MG tablet Take 10 mg by mouth at bedtime.     Multiple Vitamin (MULTIVITAMIN WITH MINERALS) TABS tablet Take 1 tablet by mouth daily.     triamcinolone (NASACORT AQ) 55 MCG/ACT nasal inhaler Place 2 sprays into the nose daily.     albuterol (PROVENTIL HFA;VENTOLIN HFA) 108 (90 Base) MCG/ACT inhaler Inhale into the lungs every 6 (six) hours as needed for wheezing or shortness of breath.     Continuous Glucose Sensor (FREESTYLE LIBRE 2 SENSOR) MISC by Does not apply route.     EPINEPHrine  0.3 mg/0.3 mL IJ SOAJ injection Inject into the muscle once.     HYDROcodone -acetaminophen  (NORCO) 5-325 MG tablet Take 1 tablet by mouth every 6 (six) hours as needed. 20 tablet 0    Results for orders placed  or performed during the hospital encounter of 05/07/24 (from the past 48 hours)  Glucose, capillary     Status: Abnormal   Collection Time: 05/07/24  9:48 AM  Result Value Ref Range   Glucose-Capillary 120 (H) 70 - 99 mg/dL    Comment: Glucose reference range applies only to samples taken after fasting for at least 8 hours.    No results found.    Blood pressure 130/75, pulse 69, temperature (!) 97 F (36.1 C), temperature source Oral, resp. rate 18, height 5\' 3"  (1.6 m), weight 88.9 kg, SpO2 100%.  General appearance: alert, cooperative, and appears stated age Head: Normocephalic, without obvious abnormality, atraumatic Neck: supple, symmetrical, trachea midline Extremities: Intact sensation and capillary refill  all digits.  +epl/fpl/io.  No wounds.  Skin: Skin color, texture, turgor normal. 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  Non operative and operative treatment options have been discussed with the patient and patient wishes to proceed with operative treatment. Risks, benefits, and alternatives of surgery have been discussed and the patient agrees with the plan of care.   Karrin Eisenmenger 05/07/2024, 10:29 AM

## 2024-05-07 NOTE — Op Note (Signed)
 NAME: [name] MEDICAL RECORD NO: [id number] DATE OF BIRTH: [birth year] FACILITY: [facility] LOCATION: [facility] PHYSICIAN: Aide Wojnar R. 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  Right long finger excision mucoid cyst 2.  Right long finger debridement of DIP joint   SURGEON:  Brunilda Capra, M.D.   ASSISTANT: none   ANESTHESIA:  Bier block with sedation   INTRAVENOUS FLUIDS:  Per anesthesia flow sheet.   ESTIMATED BLOOD LOSS:  Minimal.   COMPLICATIONS:  None.   SPECIMENS: Right long finger mucoid cyst to pathology   TOURNIQUET TIME:    Total Tourniquet Time Documented: Forearm (Right) - 23 minutes Total: Forearm (Right) - 23 minutes    DISPOSITION:  Stable to PACU.   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  This is bothersome to her.  She wishes to have the cyst removed and the DIP joint debrided to try to prevent recurrence.  Risks, benefits and alternatives of surgery were discussed including the risks of blood loss, infection, damage to nerves, vessels, tendons, ligaments, bone for surgery, need for additional surgery, complications with wound healing, continued pain, stiffness, , recurrence.  She voiced understanding of these risks and elected to proceed.  OPERATIVE COURSE:  After being identified preoperatively by myself,  the patient and I agreed on the procedure and site of the procedure.  The surgical site was marked.  Surgical consent had been signed. Preoperative IV antibiotic prophylaxis was given. She was transferred to the operating room and placed on the operating table in supine position with the right upper extremity on an arm board.  Bier block anesthesia was induced by the anesthesiologist.   Right upper extremity was prepped and draped in normal sterile orthopedic fashion.  A surgical pause was performed between the surgeons, anesthesia, and operating room staff and all were in agreement as to the patient, procedure, and site of procedure.  Tourniquet at the proximal aspect of the forearm had been inflated for the Bier block.  A hockey-stick shaped incision was made at the DIP joint.  This was carried into subcutaneous tissues by spreading technique.  Bipolar electrocautery is used to obtain hemostasis.  The cyst was freed up from surrounding soft tissues and removed.  Was filled with clear gelatinous fluid.  It was sent to pathology for examination.  The DIP joint was entered underneath the extensor tendon.  There was an osteophyte at the dorsum of the middle phalanx which was removed with the synovectomy rongeurs.  Synovectomy was performed as well.  Wound and joint were copiously irrigated with sterile saline.  The wound was closed with 4-0 nylon in a horizontal mattress fashion.  Digital block was performed with quarter percent plain Marcaine  to aid in postoperative analgesia.  Wound was dressed with sterile Xeroform 4 x 4 and wrapped with a Coban dressing lightly.  AlumaFoam splint was placed and wrapped lightly with Coban dressing.  The tourniquet was deflated at 23 minutes.  Fingertips were pink with brisk capillary refill after deflation of tourniquet.  The operative  drapes were broken down.  The patient was awoken from anesthesia safely.  She was transferred back to the stretcher and taken to PACU in stable condition.  I will see her back in the  office in 1 week for postoperative followup.  I will give her a prescription for Norco 5/325 1 tab PO q6 hours prn pain, dispense # 15.   Andilyn Bettcher, MD Electronically signed, 05/07/24

## 2024-05-07 NOTE — Transfer of Care (Signed)
 Immediate Anesthesia Transfer of Care Note  Patient: [name]  Procedure(s) Performed: REMOVAL, CYST, HAND (Right: Middle Finger) Debridement distal interphalangeal joint, right long finger (Right: Middle Finger)  Patient Location: PACU  Anesthesia Type:MAC  Level of Consciousness: drowsy, patient cooperative, and responds to stimulation  Airway & Oxygen Therapy: Patient Spontanous Breathing and Patient connected to face mask oxygen  Post-op Assessment: Report given to RN and Post -op Vital signs reviewed and stable  Post vital signs: Reviewed and stable  Last Vitals:  Vitals Value Taken Time  BP    Temp    Pulse    Resp    SpO2      Last Pain:  Vitals:   05/07/24 0941  TempSrc: Oral  PainSc: 0-No pain      Patients Stated Pain Goal: 9 (05/07/24 0941)  Complications: No notable events documented.

## 2024-05-07 NOTE — Anesthesia Postprocedure Evaluation (Signed)
 Anesthesia Post Note  Patient: [name]  Procedure(s) Performed: REMOVAL, CYST, HAND (Right: Middle Finger) Debridement distal interphalangeal joint, right long finger (Right: Middle Finger)     Patient location during evaluation: PACU Anesthesia Type: MAC and Bier Block Level of consciousness: awake Pain management: pain level controlled Vital Signs Assessment: post-procedure vital signs reviewed and stable Respiratory status: spontaneous breathing, nonlabored ventilation and respiratory function stable Cardiovascular status: stable and blood pressure returned to baseline Postop Assessment: no apparent nausea or vomiting Anesthetic complications: no   No notable events documented.  Last Vitals:  Vitals:   05/07/24 1230 05/07/24 1244  BP: (!) 140/75 (!) 147/68  Pulse: 65 (!) 52  Resp: 19 20  Temp:  36.6 C  SpO2: 98% 96%    Last Pain:  Vitals:   05/07/24 1244  TempSrc: Temporal  PainSc: 0-No pain                 Conard Decent

## 2024-05-07 NOTE — Discharge Instructions (Signed)
*  Keep hand elevated, clean and dry  Call your surgeon if you experience:   1.  Fever over 101.0. 2.  Inability to urinate. 3.  Nausea and/or vomiting. 4.  Extreme swelling or bruising at the surgical site. 5.  Continued bleeding from the incision. 6.  Increased pain, redness or drainage from the incision. 7.  Problems related to your pain medication. 8.  Any problems and/or concerns  Post Anesthesia Home Care Instructions  Activity: Get plenty of rest for the remainder of the day. A responsible individual must stay with you for 24 hours following the procedure.  For the next 24 hours, DO NOT: -Drive a car -Advertising copywriter -Drink alcoholic beverages -Take any medication unless instructed by your physician -Make any legal decisions or sign important papers.  Meals: Start with liquid foods such as gelatin or soup. Progress to regular foods as tolerated. Avoid greasy, spicy, heavy foods. If nausea and/or vomiting occur, drink only clear liquids until the nausea and/or vomiting subsides. Call your physician if vomiting continues.  Special Instructions/Symptoms: Your throat may feel dry or sore from the anesthesia or the breathing tube placed in your throat during surgery. If this causes discomfort, gargle with warm salt water. The discomfort should disappear within 24 hours.  If you had a scopolamine  patch placed behind your ear for the management of post- operative nausea and/or vomiting:  1. The medication in the patch is effective for 72 hours, after which it should be removed.  Wrap patch in a tissue and discard in the trash. Wash hands thoroughly with soap and water. 2. You may remove the patch earlier than 72 hours if you experience unpleasant side effects which may include dry mouth, dizziness or visual disturbances. 3. Avoid touching the patch. Wash your hands with soap and water after contact with the patch.

## 2024-05-08 ENCOUNTER — Encounter (HOSPITAL_BASED_OUTPATIENT_CLINIC_OR_DEPARTMENT_OTHER): Payer: Self-pay | Admitting: Orthopedic Surgery

## 2024-05-08 LAB — SURGICAL PATHOLOGY
